# Patient Record
Sex: Male | Born: 1995 | Race: Black or African American | Hispanic: No | Marital: Single | Smoking: Never smoker
Health system: Southern US, Community
[De-identification: ages and names within clinical notes are randomized; demographics above are authoritative.]

## PROBLEM LIST (undated history)

## (undated) ENCOUNTER — Encounter

## (undated) ENCOUNTER — Ambulatory Visit: Payer: BLUE CROSS/BLUE SHIELD

## (undated) ENCOUNTER — Telehealth: Attending: MOHS-Micrographic Surgery | Primary: MOHS-Micrographic Surgery

## (undated) ENCOUNTER — Ambulatory Visit: Payer: PRIVATE HEALTH INSURANCE

## (undated) ENCOUNTER — Ambulatory Visit

## (undated) ENCOUNTER — Telehealth

## (undated) DIAGNOSIS — F909 Attention-deficit hyperactivity disorder, unspecified type: Secondary | ICD-10-CM

## (undated) HISTORY — DX: Attention-deficit hyperactivity disorder, unspecified type: F90.9

## (undated) HISTORY — PX: OTHER SURGICAL HISTORY: SHX169

## (undated) HISTORY — PX: SKIN SURGERY: SHX2413

---
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
## 2017-01-11 ENCOUNTER — Ambulatory Visit
Admission: RE | Admit: 2017-01-11 | Discharge: 2017-01-11 | Disposition: A | Discharge status: Home / Self Care | Payer: MEDICAID | Attending: Student in an Organized Health Care Education/Training Program | Admitting: Student in an Organized Health Care Education/Training Program

## 2017-01-11 ENCOUNTER — Ambulatory Visit
Admission: RE | Admit: 2017-01-11 | Discharge: 2017-01-11 | Disposition: A | Discharge status: Home / Self Care | Payer: MEDICAID

## 2017-01-11 DIAGNOSIS — L732 Hidradenitis suppurativa: Principal | ICD-10-CM

## 2017-01-11 DIAGNOSIS — Z79899 Other long term (current) drug therapy: Secondary | ICD-10-CM

## 2017-01-11 DIAGNOSIS — L910 Hypertrophic scar: Secondary | ICD-10-CM

## 2017-01-11 MED ORDER — METHOTREXATE SODIUM 2.5 MG TABLET
ORAL_TABLET | 3 refills | 0 days | Status: CP
Start: 2017-01-11 — End: 2017-03-24

## 2017-01-11 MED ORDER — HYDROCODONE 5 MG-ACETAMINOPHEN 325 MG TABLET
ORAL_TABLET | 0 refills | 0 days | Status: CP
Start: 2017-01-11 — End: 2017-03-24

## 2017-01-11 MED ORDER — DIAZEPAM 5 MG TABLET
ORAL_TABLET | 0 refills | 0 days | Status: CP
Start: 2017-01-11 — End: 2017-03-24

## 2017-02-15 MED ORDER — METHOTREXATE SODIUM 2.5 MG TABLET
ORAL_TABLET | 3 refills | 0 days | Status: CP
Start: 2017-02-15 — End: 2017-06-01

## 2017-03-01 ENCOUNTER — Ambulatory Visit: Payer: Self-pay | Admitting: Nurse Practitioner

## 2017-03-08 ENCOUNTER — Encounter: Admit: 2017-03-08 | Discharge: 2017-03-09 | Payer: BLUE CROSS/BLUE SHIELD

## 2017-03-08 DIAGNOSIS — L732 Hidradenitis suppurativa: Principal | ICD-10-CM

## 2017-03-24 ENCOUNTER — Encounter: Admit: 2017-03-24 | Discharge: 2017-03-25 | Payer: BLUE CROSS/BLUE SHIELD

## 2017-03-24 DIAGNOSIS — L732 Hidradenitis suppurativa: Principal | ICD-10-CM

## 2017-03-24 MED ORDER — HYDROCODONE 5 MG-ACETAMINOPHEN 325 MG TABLET
ORAL_TABLET | Freq: Four times a day (QID) | ORAL | 0 refills | 0.00000 days | Status: CP
Start: 2017-03-24 — End: 2017-06-01

## 2017-04-12 ENCOUNTER — Ambulatory Visit: Payer: Self-pay | Admitting: Nurse Practitioner

## 2017-04-20 ENCOUNTER — Encounter: Payer: Self-pay | Admitting: Nurse Practitioner

## 2017-04-20 ENCOUNTER — Ambulatory Visit: Payer: Medicaid Other | Admitting: Nurse Practitioner

## 2017-04-20 DIAGNOSIS — R41840 Attention and concentration deficit: Secondary | ICD-10-CM

## 2017-04-20 MED ORDER — AMPHETAMINE-DEXTROAMPHETAMINE 20 MG PO TABS: 20.0000 mg | ORAL_TABLET | Freq: Two times a day (BID) | ORAL | 0 refills | Status: DC

## 2017-04-20 NOTE — Progress Notes (Signed)
[facility] [address] [place], [place] [address]  Internal MEDICINE  Office Visit Note  Patient Name: Luis Simon  [id number]  [id number]  Date of Service: 04/28/2017  Chief Complaint  Patient presents with  . Medication Refill    currently on adderall 20mg  twice daily as needed.     The patient is here for routine follow up. He is currently taking Adderall 20mg  and using 1/2 to 1 tablet twice daily to help him stay focussed and on track. When asked, he states that his grades were better this past semester, than they have ever been. States that he has no negative side effects from taking adderall. He needs to have refills for this today.     Pt is here for routine follow up.    Current Medication: Outpatient Encounter Medications as of 04/20/2017  Medication Sig Note  . amphetamine-dextroamphetamine (ADDERALL) 20 MG tablet Take 1 tablet (20 mg total) by mouth 2 (two) times daily.   . Folic Acid 5 MG CAPS Take by mouth daily.   . [DISCONTINUED] amphetamine-dextroamphetamine (ADDERALL) 20 MG tablet Take 1 tablet (20 mg total) by mouth 2 (two) times daily.   . [DISCONTINUED] amphetamine-dextroamphetamine (ADDERALL) 20 MG tablet Take 1 tablet (20 mg total) by mouth 2 (two) times daily.   . [DISCONTINUED] lisdexamfetamine (VYVANSE) 50 MG capsule Take 50 mg by mouth daily. 04/20/2017: patient taking adderall 20mg  BID   No facility-administered encounter medications on file as of 04/20/2017.     Surgical History: History reviewed. No pertinent surgical history.  Medical History: No past medical history on file.  Family History: No family history on file.  Social History   Socioeconomic History  . Marital status: Single    Spouse name: Not on file  . Number of children: Not on file  . Years of education: Not on file  . Highest education level: Not on file  Social Needs  . Financial resource strain: Not on file  . Food insecurity - worry: Not on file  .  Food insecurity - inability: Not on file  . Transportation needs - medical: Not on file  . Transportation needs - non-medical: Not on file  Occupational History  . Not on file  Tobacco Use  . Smoking status: Never Smoker  . Smokeless tobacco: Never Used  Substance and Sexual Activity  . Alcohol use: No    Frequency: Never  . Drug use: No  . Sexual activity: Not on file  Other Topics Concern  . Not on file  Social History Narrative  . Not on file      Review of Systems  Constitutional: Negative for activity change, chills, fatigue and unexpected weight change.  HENT: Negative for congestion, postnasal drip, rhinorrhea, sneezing and sore throat.   Eyes: Negative.  Negative for redness.  Respiratory: Negative for cough, chest tightness and shortness of breath.   Cardiovascular: Negative for chest pain and palpitations.  Gastrointestinal: Negative for abdominal pain, constipation, diarrhea, nausea and vomiting.  Endocrine: Negative for cold intolerance, heat intolerance, polydipsia, polyphagia and polyuria.  Genitourinary: Negative.  Negative for dysuria and frequency.  Musculoskeletal: Negative for arthralgias, back pain, joint swelling and neck pain.  Skin: Negative for rash.  Neurological: Negative for tremors, numbness and headaches.  Hematological: Negative for adenopathy. Does not bruise/bleed easily.  Psychiatric/Behavioral: Positive for decreased concentration. Negative for behavioral problems (Depression), sleep disturbance and suicidal ideas. The patient is not nervous/anxious.     Today's Vitals   04/20/17  1012  BP: 130/70  Pulse: 81  Resp: 16  SpO2: 94%  Weight: 148 lb (67.1 kg)  Height: 5\' 9"  (1.753 m)    Physical Exam  Constitutional: He is oriented to person, place, and time. He appears well-developed and well-nourished. No distress.  HENT:  Head: Normocephalic and atraumatic.  Mouth/Throat: Oropharynx is clear and moist. No oropharyngeal exudate.  Eyes:  EOM are normal. Pupils are equal, round, and reactive to light.  Neck: Normal range of motion. Neck supple. No JVD present. No tracheal deviation present. No thyromegaly present.  Cardiovascular: Normal rate, regular rhythm and normal heart sounds. Exam reveals no gallop and no friction rub.  No murmur heard. Pulmonary/Chest: Effort normal and breath sounds normal. No respiratory distress. He has no wheezes. He has no rales. He exhibits no tenderness.  Abdominal: Soft. Bowel sounds are normal. There is no tenderness.  Musculoskeletal: Normal range of motion.  Lymphadenopathy:    He has no cervical adenopathy.  Neurological: He is alert and oriented to person, place, and time. No cranial nerve deficit.  Skin: Skin is warm and dry. He is not diaphoretic.  Psychiatric: He has a normal mood and affect. His behavior is normal. Judgment and thought content normal.  Nursing note and vitals reviewed.  Assessment/Plan: 1. Attention and concentration deficit Doing well. May continue adderall 20mg  twice daily as needed. Three 30 day prescriptions provided. Dates are 04/20/2017, 05/18/2017, and 06/16/2017.  - amphetamine-dextroamphetamine (ADDERALL) 20 MG tablet; Take 1 tablet (20 mg total) by mouth 2 (two) times daily.  Dispense: 60 tablet; Refill: 0  General Counseling: Luis Simon verbalizes understanding of the findings of todays visit and agrees with plan of treatment. I have discussed any further diagnostic evaluation that may be needed or ordered today. We also reviewed his medications today. he has been encouraged to call the office with any questions or concerns that should arise related to todays visit.   This patient was seen by Vincent GrosHeather Salimata Christenson, FNP- C in Collaboration with Dr Lyndon CodeFozia M Khan as a part of collaborative care agreement   Meds ordered this encounter  Medications  . DISCONTD: amphetamine-dextroamphetamine (ADDERALL) 20 MG tablet    Sig: Take 1 tablet (20 mg total) by mouth 2 (two) times  daily.    Dispense:  60 tablet    Refill:  0    Order Specific Question:   Supervising Provider    Answer:   Lyndon CodeKHAN, FOZIA M [[id number]]  . DISCONTD: amphetamine-dextroamphetamine (ADDERALL) 20 MG tablet    Sig: Take 1 tablet (20 mg total) by mouth 2 (two) times daily.    Dispense:  60 tablet    Refill:  0    Fill after 05/18/2017    Order Specific Question:   Supervising Provider    Answer:   Lyndon CodeKHAN, FOZIA M [[id number]]  . amphetamine-dextroamphetamine (ADDERALL) 20 MG tablet    Sig: Take 1 tablet (20 mg total) by mouth 2 (two) times daily.    Dispense:  60 tablet    Refill:  0    Fill after 06/16/2017    Order Specific Question:   Supervising Provider    Answer:   Lyndon CodeKHAN, FOZIA M [[id number]]    Time spent: 2515 Minutes    Dr Lyndon CodeFozia M Khan Internal medicine

## 2017-04-28 ENCOUNTER — Encounter: Payer: Self-pay | Admitting: Nurse Practitioner

## 2017-05-11 ENCOUNTER — Ambulatory Visit: Admit: 2017-05-11 | Discharge: 2017-05-12 | Payer: BLUE CROSS/BLUE SHIELD

## 2017-05-11 DIAGNOSIS — L732 Hidradenitis suppurativa: Principal | ICD-10-CM

## 2017-05-21 MED ORDER — PREDNISONE 20 MG TABLET
ORAL_TABLET | ORAL | 0 refills | 0.00000 days | Status: CP
Start: 2017-05-21 — End: 2017-06-01

## 2017-05-21 MED ORDER — TRAMADOL 50 MG TABLET
ORAL_TABLET | Freq: Four times a day (QID) | ORAL | 0 refills | 0.00000 days | Status: CP | PRN
Start: 2017-05-21 — End: 2017-10-13

## 2017-06-01 ENCOUNTER — Encounter: Admit: 2017-06-01 | Discharge: 2017-06-02 | Payer: MEDICAID

## 2017-06-01 DIAGNOSIS — Z79899 Other long term (current) drug therapy: Secondary | ICD-10-CM

## 2017-06-01 DIAGNOSIS — L732 Hidradenitis suppurativa: Principal | ICD-10-CM

## 2017-06-01 DIAGNOSIS — L910 Hypertrophic scar: Secondary | ICD-10-CM

## 2017-06-01 MED ORDER — HYDROCODONE 5 MG-ACETAMINOPHEN 325 MG TABLET
ORAL_TABLET | Freq: Four times a day (QID) | ORAL | 0 refills | 0.00000 days | Status: CP | PRN
Start: 2017-06-01 — End: 2017-08-25

## 2017-06-01 MED ORDER — FOLIC ACID 1 MG TABLET
ORAL_TABLET | Freq: Every day | ORAL | 3 refills | 0.00000 days | Status: CP
Start: 2017-06-01 — End: 2018-06-01

## 2017-06-01 MED ORDER — METHOTREXATE SODIUM 2.5 MG TABLET
ORAL_TABLET | ORAL | 3 refills | 0.00000 days | Status: CP
Start: 2017-06-01 — End: 2017-08-25

## 2017-06-21 ENCOUNTER — Telehealth: Payer: Self-pay

## 2017-06-21 DIAGNOSIS — R41840 Attention and concentration deficit: Secondary | ICD-10-CM

## 2017-06-21 MED ORDER — AMPHETAMINE-DEXTROAMPHETAMINE 20 MG PO TABS: 20.0000 mg | ORAL_TABLET | Freq: Two times a day (BID) | ORAL | 0 refills | Status: DC

## 2017-06-21 NOTE — Telephone Encounter (Signed)
[facility] [address]  should have another prescription for him. I sent three the day he was seen. Lat one is fill after 4/17/20169.

## 2017-06-22 NOTE — Telephone Encounter (Signed)
Pt mom advised we send pres to phar

## 2017-07-06 ENCOUNTER — Encounter: Admit: 2017-07-06 | Discharge: 2017-07-07 | Payer: BLUE CROSS/BLUE SHIELD

## 2017-07-06 DIAGNOSIS — L732 Hidradenitis suppurativa: Principal | ICD-10-CM

## 2017-07-20 ENCOUNTER — Ambulatory Visit (INDEPENDENT_AMBULATORY_CARE_PROVIDER_SITE_OTHER): Payer: Medicaid Other | Admitting: Nurse Practitioner

## 2017-07-20 ENCOUNTER — Encounter: Payer: Self-pay | Admitting: Nurse Practitioner

## 2017-07-20 VITALS — BP 104/84 | HR 56 | Resp 16 | Ht 69.0 in | Wt 154.0 lb

## 2017-07-20 DIAGNOSIS — L700 Acne vulgaris: Secondary | ICD-10-CM

## 2017-07-20 DIAGNOSIS — R41840 Attention and concentration deficit: Secondary | ICD-10-CM

## 2017-07-20 MED ORDER — CLINDAMYCIN PHOS-BENZOYL PEROX 1-5 % EX GEL: Freq: Two times a day (BID) | CUTANEOUS | 3 refills | Status: DC

## 2017-07-20 MED ORDER — AMPHETAMINE-DEXTROAMPHETAMINE 20 MG PO TABS
20.0000 mg | ORAL_TABLET | Freq: Two times a day (BID) | ORAL | 0 refills | Status: DC
Start: 2017-07-20 — End: 2017-07-20

## 2017-07-20 MED ORDER — AMPHETAMINE-DEXTROAMPHETAMINE 20 MG PO TABS: 20.0000 mg | ORAL_TABLET | Freq: Two times a day (BID) | ORAL | 0 refills | Status: DC

## 2017-07-20 NOTE — Progress Notes (Signed)
[facility] [address] [place], [place] [address]  Internal MEDICINE  Office Visit Note  Patient Name: Luis Simon  [id number]  [id number]  Date of Service: 07/20/2017  Chief Complaint  Patient presents with  . Medication Refill  . Acne    The patient is here for routine follow up. He is currently taking Adderall  and using 1/2 to 1 tablet twice daily to help him stay focussed and on track. He continues to do well in school. Grades are good. He started summer classes, online, yesterday. He states that he has no negative side effects from taking adderall. Has no issues with sleep.  He has started to notice facial acne. Breaking out around forehead and chin, especially. He has been using OTC Neutrogena wash for acne as well as OTC spot treatment. He has not noted any improvement.    Pt is here for routine follow up.    Current Medication: Outpatient Encounter Medications as of 07/20/2017  Medication Sig  . amphetamine-dextroamphetamine (ADDERALL) 20 MG tablet Take 1 tablet (20 mg total) by mouth 2 (two) times daily.  . clindamycin-benzoyl peroxide (BENZACLIN) gel Apply topically 2 (two) times daily.  . Folic Acid 5 MG CAPS Take by mouth daily.  . [DISCONTINUED] amphetamine-dextroamphetamine (ADDERALL) 20 MG tablet Take 1 tablet (20 mg total) by mouth 2 (two) times daily.  . [DISCONTINUED] amphetamine-dextroamphetamine (ADDERALL) 20 MG tablet Take 1 tablet (20 mg total) by mouth 2 (two) times daily.  . [DISCONTINUED] amphetamine-dextroamphetamine (ADDERALL) 20 MG tablet Take 1 tablet (20 mg total) by mouth 2 (two) times daily.   No facility-administered encounter medications on file as of 07/20/2017.     Surgical History: Past Surgical History:  Procedure Laterality Date  . none      Medical History: Past Medical History:  Diagnosis Date  . ADHD     Family History: Family History  Family history unknown: Yes    Social History   Socioeconomic  History  . Marital status: Single    Spouse name: Not on file  . Number of children: Not on file  . Years of education: Not on file  . Highest education level: Not on file  Occupational History  . Not on file  Social Needs  . Financial resource strain: Not on file  . Food insecurity:    Worry: Not on file    Inability: Not on file  . Transportation needs:    Medical: Not on file    Non-medical: Not on file  Tobacco Use  . Smoking status: Never Smoker  . Smokeless tobacco: Never Used  Substance and Sexual Activity  . Alcohol use: No    Frequency: Never  . Drug use: No  . Sexual activity: Not on file  Lifestyle  . Physical activity:    Days per week: Not on file    Minutes per session: Not on file  . Stress: Not on file  Relationships  . Social connections:    Talks on phone: Not on file    Gets together: Not on file    Attends religious service: Not on file    Active member of club or organization: Not on file    Attends meetings of clubs or organizations: Not on file    Relationship status: Not on file  . Intimate partner violence:    Fear of current or ex partner: Not on file    Emotionally abused: Not on file    Physically abused: Not on  file    Forced sexual activity: Not on file  Other Topics Concern  . Not on file  Social History Narrative  . Not on file      Review of Systems  Constitutional: Negative for activity change, chills, fatigue and unexpected weight change.  HENT: Negative for congestion, postnasal drip, rhinorrhea, sneezing and sore throat.   Eyes: Negative.  Negative for redness.  Respiratory: Negative for cough, chest tightness and shortness of breath.   Cardiovascular: Negative for chest pain and palpitations.  Gastrointestinal: Negative for abdominal pain, constipation, diarrhea, nausea and vomiting.  Endocrine: Negative for cold intolerance, heat intolerance, polydipsia, polyphagia and polyuria.  Genitourinary: Negative.  Negative for  dysuria and frequency.  Musculoskeletal: Negative for arthralgias, back pain, joint swelling and neck pain.  Skin: Negative for rash.       Acne on the face, mostly on forehead and chin.   Neurological: Negative for tremors, numbness and headaches.  Hematological: Negative for adenopathy. Does not bruise/bleed easily.  Psychiatric/Behavioral: Positive for decreased concentration. Negative for behavioral problems (Depression), sleep disturbance and suicidal ideas. The patient is not nervous/anxious.     Vital Signs: BP 104/84   Pulse (!) 56   Resp 16   Ht  (1.753 m)   Wt 154 lb (69.9 kg)   SpO2 97%   BMI 22.74 kg/m    Physical Exam  Constitutional: He is oriented to person, place, and time. He appears well-developed and well-nourished. No distress.  HENT:  Head: Normocephalic and atraumatic.  Mouth/Throat: Oropharynx is clear and moist. No oropharyngeal exudate.  Eyes: Pupils are equal, round, and reactive to light. EOM are normal.  Neck: Normal range of motion. Neck supple. No JVD present. No tracheal deviation present. No thyromegaly present.  Cardiovascular: Normal rate, regular rhythm and normal heart sounds. Exam reveals no gallop and no friction rub.  No murmur heard. Pulmonary/Chest: Effort normal and breath sounds normal. No respiratory distress. He has no wheezes. He has no rales. He exhibits no tenderness.  Abdominal: Soft. Bowel sounds are normal. There is no tenderness.  Musculoskeletal: Normal range of motion.  Lymphadenopathy:    He has no cervical adenopathy.  Neurological: He is alert and oriented to person, place, and time. No cranial nerve deficit.  Skin: Skin is warm and dry. He is not diaphoretic.  Mild facial acne on forehead and chin. No evidence on inflammation or cellulitis at this time.  Psychiatric: He has a normal mood and affect. His behavior is normal. Judgment and thought content normal.  Nursing note and vitals reviewed.  Assessment/Plan: 1.  Attention and concentration deficit May continue adderall  twice daily as needed. Three 30 day prescriptions sent to pharmacy. Dates on prescriptions are 07/20/2017, 08/18/2017, and 09/15/2017 - amphetamine-dextroamphetamine (ADDERALL) 20 MG tablet; Take 1 tablet (20 mg total) by mouth 2 (two) times daily.  Dispense: 60 tablet; Refill: 0  2. Acne vulgaris Use benzaclin twice daily as needed. Use sparingly to prevent negative side effects . - clindamycin-benzoyl peroxide (BENZACLIN) gel; Apply topically 2 (two) times daily.  Dispense: 25 g; Refill: 3  General Counseling: Kolbe verbalizes understanding of the findings of todays visit and agrees with plan of treatment. I have discussed any further diagnostic evaluation that may be needed or ordered today. We also reviewed his medications today. he has been encouraged to call the office with any questions or concerns that should arise related to todays visit.   This patient was seen by Vincent Gros, FNP- C  in Collaboration with Dr Lyndon Code as a part of collaborative care agreement   Meds ordered this encounter  Medications  . DISCONTD: amphetamine-dextroamphetamine (ADDERALL) 20 MG tablet    Sig: Take 1 tablet (20 mg total) by mouth 2 (two) times daily.    Dispense:  60 tablet    Refill:  0    Order Specific Question:   Supervising Provider    Answer:   Lyndon Code [[id number]]  . DISCONTD: amphetamine-dextroamphetamine (ADDERALL) 20 MG tablet    Sig: Take 1 tablet (20 mg total) by mouth 2 (two) times daily.    Dispense:  60 tablet    Refill:  0    Fill after 08/18/2017    Order Specific Question:   Supervising Provider    Answer:   Lyndon Code [[id number]]  . amphetamine-dextroamphetamine (ADDERALL) 20 MG tablet    Sig: Take 1 tablet (20 mg total) by mouth 2 (two) times daily.    Dispense:  60 tablet    Refill:  0    Fill after 09/15/2017    Order Specific Question:   Supervising Provider    Answer:   Lyndon Code [[id number]]  .  clindamycin-benzoyl peroxide (BENZACLIN) gel    Sig: Apply topically 2 (two) times daily.    Dispense:  25 g    Refill:  3    Order Specific Question:   Supervising Provider    Answer:   Lyndon Code [[id number]]    Time spent: 5 Minutes     Dr Lyndon Code Internal medicine

## 2017-08-25 ENCOUNTER — Encounter: Admit: 2017-08-25 | Discharge: 2017-08-26 | Payer: BLUE CROSS/BLUE SHIELD

## 2017-08-25 DIAGNOSIS — L732 Hidradenitis suppurativa: Principal | ICD-10-CM

## 2017-08-25 DIAGNOSIS — Z79899 Other long term (current) drug therapy: Secondary | ICD-10-CM

## 2017-08-25 MED ORDER — METHOTREXATE SODIUM 2.5 MG TABLET
ORAL_TABLET | ORAL | 3 refills | 0.00000 days | Status: CP
Start: 2017-08-25 — End: ?

## 2017-08-25 MED ORDER — HYDROCODONE 5 MG-ACETAMINOPHEN 325 MG TABLET
ORAL_TABLET | Freq: Four times a day (QID) | ORAL | 0 refills | 0.00000 days | Status: CP | PRN
Start: 2017-08-25 — End: 2017-10-13

## 2017-08-25 MED ORDER — TRIAMCINOLONE ACETONIDE 40 MG/ML SUSPENSION FOR INJECTION
Freq: Once | 0 refills | 0.00000 days | Status: CP
Start: 2017-08-25 — End: 2017-08-25

## 2017-09-21 ENCOUNTER — Encounter: Admit: 2017-09-21 | Discharge: 2017-09-22 | Payer: BLUE CROSS/BLUE SHIELD

## 2017-09-21 DIAGNOSIS — L732 Hidradenitis suppurativa: Principal | ICD-10-CM

## 2017-09-28 ENCOUNTER — Ambulatory Visit: Admit: 2017-09-28 | Discharge: 2017-09-29 | Payer: BLUE CROSS/BLUE SHIELD

## 2017-09-28 DIAGNOSIS — R52 Pain, unspecified: Secondary | ICD-10-CM

## 2017-09-28 DIAGNOSIS — L732 Hidradenitis suppurativa: Principal | ICD-10-CM

## 2017-09-28 MED ORDER — HYDROCODONE 5 MG-ACETAMINOPHEN 325 MG TABLET
ORAL_TABLET | Freq: Four times a day (QID) | ORAL | 0 refills | 0.00000 days | Status: CP | PRN
Start: 2017-09-28 — End: 2017-10-13

## 2017-10-13 ENCOUNTER — Encounter: Admit: 2017-10-13 | Discharge: 2017-10-14 | Payer: BLUE CROSS/BLUE SHIELD

## 2017-10-13 ENCOUNTER — Encounter: Payer: Self-pay | Admitting: Adult Health

## 2017-10-13 ENCOUNTER — Ambulatory Visit (INDEPENDENT_AMBULATORY_CARE_PROVIDER_SITE_OTHER): Payer: Medicaid Other | Admitting: Adult Health

## 2017-10-13 VITALS — BP 120/80 | HR 78 | Resp 16 | Ht 69.0 in | Wt 148.0 lb

## 2017-10-13 DIAGNOSIS — G8918 Other acute postprocedural pain: Secondary | ICD-10-CM

## 2017-10-13 DIAGNOSIS — L732 Hidradenitis suppurativa: Principal | ICD-10-CM

## 2017-10-13 DIAGNOSIS — L709 Acne, unspecified: Secondary | ICD-10-CM | POA: Diagnosis not present

## 2017-10-13 DIAGNOSIS — Z79899 Other long term (current) drug therapy: Secondary | ICD-10-CM

## 2017-10-13 DIAGNOSIS — R41840 Attention and concentration deficit: Secondary | ICD-10-CM | POA: Diagnosis not present

## 2017-10-13 LAB — POCT URINE DRUG SCREEN
POC Amphetamine UR: NOT DETECTED
POC BENZODIAZEPINES UR: NOT DETECTED
POC Barbiturate UR: NOT DETECTED
POC COCAINE UR: NOT DETECTED
POC ECSTASY UR: NOT DETECTED
POC MARIJUANA UR: POSITIVE — AB
POC METHADONE UR: NOT DETECTED
POC Methamphetamine UR: NOT DETECTED
POC Opiate Ur: NOT DETECTED
POC Oxycodone UR: NOT DETECTED
POC PHENCYCLIDINE UR: NOT DETECTED
POC TRICYCLICS UR: NOT DETECTED

## 2017-10-13 MED ORDER — AMPHETAMINE-DEXTROAMPHETAMINE 20 MG PO TABS: 20.0000 mg | ORAL_TABLET | Freq: Two times a day (BID) | ORAL | 0 refills | Status: DC

## 2017-10-13 MED ORDER — HYDROCODONE 5 MG-ACETAMINOPHEN 325 MG TABLET
ORAL_TABLET | Freq: Four times a day (QID) | ORAL | 0 refills | 0 days | Status: CP | PRN
Start: 2017-10-13 — End: ?

## 2017-10-13 NOTE — Progress Notes (Signed)
Luis Simon [address] [place], [place] [id number]  Internal MEDICINE  Office Visit Note  Patient Name: Luis Simon  [birth year]  [id number]  Date of Service: 11/02/2017  Chief Complaint  Patient presents with  . ADHD    Medication refills    HPI Pt here for follow up and medication refill. He reports using 20mg  Adderall twice daily for concentration and attention.  He denies chest pain, sob, palpitations, headaches or other side effects of the medication.  He recently had a cyst removed from his right axilla two weeks ago.     Current Medication: Outpatient Encounter Medications as of 10/13/2017  Medication Sig  . methotrexate (RHEUMATREX) 2.5 MG tablet Take by mouth.  Marland Kitchen. amphetamine-dextroamphetamine (ADDERALL) 20 MG tablet Take 1 tablet (20 mg total) by mouth 2 (two) times daily.  Melene Muller. [START ON 11/12/2017] amphetamine-dextroamphetamine (ADDERALL) 20 MG tablet Take 1 tablet (20 mg total) by mouth 2 (two) times daily.  Melene Muller. [START ON 12/12/2017] amphetamine-dextroamphetamine (ADDERALL) 20 MG tablet Take 1 tablet (20 mg total) by mouth 2 (two) times daily.  . clindamycin-benzoyl peroxide (BENZACLIN) gel Apply topically 2 (two) times daily.  . Folic Acid 5 MG CAPS Take by mouth daily.  . [DISCONTINUED] amphetamine-dextroamphetamine (ADDERALL) 20 MG tablet Take 1 tablet (20 mg total) by mouth 2 (two) times daily.   No facility-administered encounter medications on file as of 10/13/2017.     Surgical History: Past Surgical History:  Procedure Laterality Date  . none    . SKIN SURGERY Right    right arm anfd left groin    Medical History: Past Medical History:  Diagnosis Date  . ADHD     Family History: Family History  Family history unknown: Yes    Social History   Socioeconomic History  . Marital status: Single    Spouse name: Not on file  . Number of children: Not on file  . Years of education: Not on file  . Highest education level: Not on file   Occupational History  . Not on file  Social Needs  . Financial resource strain: Not on file  . Food insecurity:    Worry: Not on file    Inability: Not on file  . Transportation needs:    Medical: Not on file    Non-medical: Not on file  Tobacco Use  . Smoking status: Never Smoker  . Smokeless tobacco: Never Used  Substance and Sexual Activity  . Alcohol use: No    Frequency: Never  . Drug use: No  . Sexual activity: Not on file  Lifestyle  . Physical activity:    Days per week: Not on file    Minutes per session: Not on file  . Stress: Not on file  Relationships  . Social connections:    Talks on phone: Not on file    Gets together: Not on file    Attends religious service: Not on file    Active member of club or organization: Not on file    Attends meetings of clubs or organizations: Not on file    Relationship status: Not on file  . Intimate partner violence:    Fear of current or ex partner: Not on file    Emotionally abused: Not on file    Physically abused: Not on file    Forced sexual activity: Not on file  Other Topics Concern  . Not on file  Social History Narrative  . Not on file  Review of Systems  Constitutional: Negative.  Negative for chills, fatigue and unexpected weight change.  HENT: Negative.  Negative for congestion, rhinorrhea, sneezing and sore throat.   Eyes: Negative for redness.  Respiratory: Negative.  Negative for cough, chest tightness and shortness of breath.   Cardiovascular: Negative.  Negative for chest pain and palpitations.  Gastrointestinal: Negative.  Negative for abdominal pain, constipation, diarrhea, nausea and vomiting.  Endocrine: Negative.   Genitourinary: Negative.  Negative for dysuria and frequency.  Musculoskeletal: Negative.  Negative for arthralgias, back pain, joint swelling and neck pain.  Skin: Negative.  Negative for rash.  Allergic/Immunologic: Negative.   Neurological: Negative.  Negative for tremors and  numbness.  Hematological: Negative for adenopathy. Does not bruise/bleed easily.  Psychiatric/Behavioral: Negative.  Negative for behavioral problems, sleep disturbance and suicidal ideas. The patient is not nervous/anxious.     Vital Signs: BP 120/80   Pulse 78   Resp 16   Ht 5\' 9"  (1.753 m)   Wt 148 lb (67.1 kg)   SpO2 98%   BMI 21.86 kg/m    Physical Exam  Constitutional: He is oriented to person, place, and time. He appears well-developed and well-nourished. No distress.  HENT:  Head: Normocephalic and atraumatic.  Mouth/Throat: Oropharynx is clear and moist. No oropharyngeal exudate.  Eyes: Pupils are equal, round, and reactive to light. EOM are normal.  Neck: Normal range of motion. Neck supple. No JVD present. No tracheal deviation present. No thyromegaly present.  Cardiovascular: Normal rate, regular rhythm and normal heart sounds. Exam reveals no gallop and no friction rub.  No murmur heard. Pulmonary/Chest: Effort normal and breath sounds normal. No respiratory distress. He has no wheezes. He has no rales. He exhibits no tenderness.  Abdominal: Soft. There is no tenderness. There is no guarding.  Musculoskeletal: Normal range of motion.  Lymphadenopathy:    He has no cervical adenopathy.  Neurological: He is alert and oriented to person, place, and time. No cranial nerve deficit.  Skin: Skin is warm and dry. He is not diaphoretic.  Psychiatric: He has a normal mood and affect. His behavior is normal. Judgment and thought content normal.  Nursing note and vitals reviewed.  Assessment/Plan: 1. Encounter for long-term (current) use of medications Drug screen positive for THC, discussed this with patient. Also, Drug screen not positive for Adderall, pt has been out for a few days.  Discussed that if next UDS not positive for Adderall we would no longer fill RX.  - POCT Urine Drug Screen  2. Attention and concentration deficit Rs sent to pharmacy -  amphetamine-dextroamphetamine (ADDERALL) 20 MG tablet; Take 1 tablet (20 mg total) by mouth 2 (two) times daily.  Dispense: 60 tablet; Refill: 0  3. Acne, unspecified acne type Stable, pt has good results with Benzaclin.  General Counseling: Ryo verbalizes understanding of the findings of todays visit and agrees with plan of treatment. I have discussed any further diagnostic evaluation that may be needed or ordered today. We also reviewed his medications today. he has been encouraged to call the office with any questions or concerns that should arise related to todays visit.  He continues to do well in school, and does not have issues sleeping.     Orders Placed This Encounter  Procedures  . POCT Urine Drug Screen    Meds ordered this encounter  Medications  . amphetamine-dextroamphetamine (ADDERALL) 20 MG tablet    Sig: Take 1 tablet (20 mg total) by mouth 2 (two) times daily.  Dispense:  60 tablet    Refill:  0    Fill after 09/15/2017  . amphetamine-dextroamphetamine (ADDERALL) 20 MG tablet    Sig: Take 1 tablet (20 mg total) by mouth 2 (two) times daily.    Dispense:  60 tablet    Refill:  0    Do not fill before 11/12/2017  . amphetamine-dextroamphetamine (ADDERALL) 20 MG tablet    Sig: Take 1 tablet (20 mg total) by mouth 2 (two) times daily.    Dispense:  60 tablet    Refill:  0    Do not fill before 12/12/2017    Time spent:25  Minutes   This patient was seen by Blima Ledger AGNP-C in Collaboration with Dr Lyndon Code as a part of collaborative care agreement    Dr Lyndon Code Internal medicine

## 2017-10-13 NOTE — Patient Instructions (Signed)
Amphetamine; Dextroamphetamine tablets What is this medicine? AMPHETAMINE; DEXTROAMPHETAMINE(am FET a meen; dex troe am FET a meen) is used to treat attention-deficit hyperactivity disorder (ADHD). It may also be used for narcolepsy. Federal law prohibits giving this medicine to any person other than the person for whom it was prescribed. Do not share this medicine with anyone else. This medicine may be used for other purposes; ask your health care provider or pharmacist if you have questions. COMMON BRAND NAME(S): Adderall What should I tell my health care provider before I take this medicine? They need to know if you have any of these conditions: -anxiety or panic attacks -circulation problems in fingers and toes -glaucoma -hardening or blockages of the arteries or heart blood vessels -heart disease or a heart defect -high blood pressure -history of a drug or alcohol abuse problem -history of stroke -kidney disease -liver disease -mental illness -seizures -suicidal thoughts, plans, or attempt; a previous suicide attempt by you or a family member -thyroid disease -Tourette's syndrome -an unusual or allergic reaction to dextroamphetamine, other amphetamines, other medicines, foods, dyes, or preservatives -pregnant or trying to get pregnant -breast-feeding How should I use this medicine? Take this medicine by mouth with a glass of water. Follow the directions on the prescription label. Take your doses at regular intervals. Do not take your medicine more often than directed. Do not suddenly stop your medicine. You must gradually reduce the dose or you may feel withdrawal effects. Ask your doctor or health care professional for advice. Talk to your pediatrician regarding the use of this medicine in children. Special care may be needed. While this drug may be prescribed for children as young as 3 years for selected conditions, precautions do apply. Overdosage: If you think you have taken too  much of this medicine contact a poison control center or emergency room at once. NOTE: This medicine is only for you. Do not share this medicine with others. What if I miss a dose? If you miss a dose, take it as soon as you can. If it is almost time for your next dose, take only that dose. Do not take double or extra doses. What may interact with this medicine? Do not take this medicine with any of the following medications: -MAOIS like Carbex, Eldepryl, Marplan, Nardil, and Parnate -other stimulant medicines for attention disorders, weight loss, or to stay awake This medicine may also interact with the following medications: -acetazolamide -ammonium chloride -antacids -ascorbic acid -atomoxetine -caffeine -certain medicines for blood pressure -certain medicines for depression, anxiety, or psychotic disturbances -certain medicines for seizures like carbamazepine, phenobarbital, phenytoin -certain medicines for stomach problems like cimetidine, famotidine, omeprazole, lansoprazole -cold or allergy medicines -glutamic acid -lithium -meperidine -methenamine; sodium acid phosphate -narcotic medicines for pain -norepinephrine -phenothiazines like chlorpromazine, mesoridazine, prochlorperazine, thioridazine -sodium acid phosphate -sodium bicarbonate This list may not describe all possible interactions. Give your health care provider a list of all the medicines, herbs, non-prescription drugs, or dietary supplements you use. Also tell them if you smoke, drink alcohol, or use illegal drugs. Some items may interact with your medicine. What should I watch for while using this medicine? Visit your doctor or health care professional for regular checks on your progress. This prescription requires that you follow special procedures with your doctor and pharmacy. You will need to have a new written prescription from your doctor every time you need a refill. This medicine may affect your  concentration, or hide signs of tiredness. Until you know how this   medicine affects you, do not drive, ride a bicycle, use machinery, or do anything that needs mental alertness. Tell your doctor or health care professional if this medicine loses its effects, or if you feel you need to take more than the prescribed amount. Do not change the dosage without talking to your doctor or health care professional. Decreased appetite is a common side effect when starting this medicine. Eating small, frequent meals or snacks can help. Talk to your doctor if you continue to have poor eating habits. Height and weight growth of a child taking this medicine will be monitored closely. Do not take this medicine close to bedtime. It may prevent you from sleeping. If you are going to need surgery, a MRI, CT scan, or other procedure, tell your doctor that you are taking this medicine. You may need to stop taking this medicine before the procedure. Tell your doctor or healthcare professional right away if you notice unexplained wounds on your fingers and toes while taking this medicine. You should also tell your healthcare provider if you experience numbness or pain, changes in the skin color, or sensitivity to temperature in your fingers or toes. What side effects may I notice from receiving this medicine? Side effects that you should report to your doctor or health care professional as soon as possible: -allergic reactions like skin rash, itching or hives, swelling of the face, lips, or tongue -changes in vision -chest pain or chest tightness -confusion, trouble speaking or understanding -fast, irregular heartbeat -fingers or toes feel numb, cool, painful -hallucination, loss of contact with reality -high blood pressure -males: prolonged or painful erection -seizures -severe headaches -shortness of breath -suicidal thoughts or other mood changes -trouble walking, dizziness, loss of balance or  coordination -uncontrollable head, mouth, neck, arm, or leg movements Side effects that usually do not require medical attention (report to your doctor or health care professional if they continue or are bothersome): -anxious -headache -loss of appetite -nausea, vomiting -trouble sleeping -weight loss This list may not describe all possible side effects. Call your doctor for medical advice about side effects. You may report side effects to FDA at 1-800-FDA-1088. Where should I keep my medicine? Keep out of the reach of children. This medicine can be abused. Keep your medicine in a safe place to protect it from theft. Do not share this medicine with anyone. Selling or giving away this medicine is dangerous and against the law. Store at room temperature between 15 and 30 degrees C (59 and 86 degrees F). Keep container tightly closed. Throw away any unused medicine after the expiration date. Dispose of properly. This medicine may cause accidental overdose and death if it is taken by other adults, children, or pets. Mix any unused medicine with a substance like cat litter or coffee grounds. Then throw the medicine away in a sealed container like a sealed bag or a coffee can with a lid. Do not use the medicine after the expiration date. NOTE: This sheet is a summary. It may not cover all possible information. If you have questions about this medicine, talk to your doctor, pharmacist, or health care provider.  2018 Elsevier/Gold Standard (2013-12-20 18:44:41)  

## 2017-10-15 ENCOUNTER — Ambulatory Visit: Payer: Self-pay | Admitting: Nurse Practitioner

## 2018-01-11 ENCOUNTER — Ambulatory Visit: Payer: Self-pay | Admitting: Nurse Practitioner

## 2018-01-17 ENCOUNTER — Ambulatory Visit: Payer: Self-pay | Admitting: Nurse Practitioner

## 2018-01-17 ENCOUNTER — Ambulatory Visit (INDEPENDENT_AMBULATORY_CARE_PROVIDER_SITE_OTHER): Payer: Medicaid Other | Admitting: Nurse Practitioner

## 2018-01-17 ENCOUNTER — Encounter: Payer: Self-pay | Admitting: Nurse Practitioner

## 2018-01-17 VITALS — BP 136/87 | HR 84 | Resp 16 | Ht 69.0 in | Wt 161.2 lb

## 2018-01-17 DIAGNOSIS — R41840 Attention and concentration deficit: Secondary | ICD-10-CM | POA: Diagnosis not present

## 2018-01-17 DIAGNOSIS — L732 Hidradenitis suppurativa: Secondary | ICD-10-CM | POA: Insufficient documentation

## 2018-01-17 MED ORDER — AMPHETAMINE-DEXTROAMPHETAMINE 20 MG PO TABS: 20.0000 mg | ORAL_TABLET | Freq: Two times a day (BID) | ORAL | 0 refills | Status: DC

## 2018-01-17 MED ORDER — AMPHETAMINE-DEXTROAMPHETAMINE 20 MG PO TABS
20.0000 mg | ORAL_TABLET | Freq: Two times a day (BID) | ORAL | 0 refills | Status: DC
Start: 2018-01-17 — End: 2018-01-17

## 2018-01-17 NOTE — Progress Notes (Signed)
[facility] [address] [place], [place] [address]  Internal MEDICINE  Office Visit Note  Patient Name: Luis Simon  1995  [id number]  Date of Service: 01/19/2018  Chief Complaint  Patient presents with  . ADHD    The patinet is here for routine follow up of ADHD. Currently taking adderall 20mg  twice daily when needed. 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 Has noted a big improvement in his grades since he started taking Adderall. Getting assignments turned in on time and Is able to pay attention in class without problems. He needs to have refills of this medication today.  The patient has visited dermatologist since his last visit. Has been diagnosed with hidradenitis suppurative. He is now on weekly dose of methotrexate and uses benzaclin wash to help reduce incidences of flares. He recently started this treatment. Feels that flares are a little less severe.       Current Medication: Outpatient Encounter Medications as of 01/17/2018  Medication Sig  . amphetamine-dextroamphetamine (ADDERALL) 20 MG tablet Take 1 tablet (20 mg total) by mouth 2 (two) times daily.  . clindamycin-benzoyl peroxide (BENZACLIN) gel Apply topically 2 (two) times daily.  . Folic Acid 5 MG CAPS Take by mouth daily.  . methotrexate (RHEUMATREX) 2.5 MG tablet Take by mouth.  . [DISCONTINUED] amphetamine-dextroamphetamine (ADDERALL) 20 MG tablet Take 1 tablet (20 mg total) by mouth 2 (two) times daily.  . [DISCONTINUED] amphetamine-dextroamphetamine (ADDERALL) 20 MG tablet Take 1 tablet (20 mg total) by mouth 2 (two) times daily.  . [DISCONTINUED] amphetamine-dextroamphetamine (ADDERALL) 20 MG tablet Take 1 tablet (20 mg total) by mouth 2 (two) times daily.  . [DISCONTINUED] amphetamine-dextroamphetamine (ADDERALL) 20 MG tablet Take 1 tablet (20 mg total) by mouth 2 (two) times daily.  . [DISCONTINUED]  amphetamine-dextroamphetamine (ADDERALL) 20 MG tablet Take 1 tablet (20 mg total) by mouth 2 (two) times daily.   No facility-administered encounter medications on file as of 01/17/2018.     Surgical History: Past Surgical History:  Procedure Laterality Date  . none    . SKIN SURGERY Right    right arm anfd left groin    Medical History: Past Medical History:  Diagnosis Date  . ADHD     Family History: Family History  Family history unknown: Yes    Social History   Socioeconomic History  . Marital status: Single    Spouse name: Not on file  . Number of children: Not on file  . Years of education: Not on file  . Highest education level: Not on file  Occupational History  . Not on file  Social Needs  . Financial resource strain: Not on file  . Food insecurity:    Worry: Not on file    Inability: Not on file  . Transportation needs:    Medical: Not on file    Non-medical: Not on file  Tobacco Use  . Smoking status: Never Smoker  . Smokeless tobacco: Never Used  Substance and Sexual Activity  . Alcohol use: No    Frequency: Never  . Drug use: No  . Sexual activity: Not on file  Lifestyle  . Physical activity:    Days per week: Not on file    Minutes per session: Not on file  . Stress: Not on file  Relationships  . Social connections:    Talks on phone: Not on file  Gets together: Not on file    Attends religious service: Not on file    Active member of club or organization: Not on file    Attends meetings of clubs or organizations: Not on file    Relationship status: Not on file  . Intimate partner violence:    Fear of current or ex partner: Not on file    Emotionally abused: Not on file    Physically abused: Not on file    Forced sexual activity: Not on file  Other Topics Concern  . Not on file  Social History Narrative  . Not on file      Review of Systems  Constitutional: Negative for activity change, chills, fatigue and unexpected weight  change.  HENT: Negative for congestion, postnasal drip, rhinorrhea, sneezing and sore throat.   Respiratory: Negative for cough, chest tightness and shortness of breath.   Cardiovascular: Negative for chest pain and palpitations.  Endocrine: Negative for cold intolerance, heat intolerance, polydipsia, polyphagia and polyuria.  Skin: Negative for rash.       Improved acne on the face and under the arms.   Neurological: Negative for tremors and headaches.  Hematological: Negative for adenopathy. Does not bruise/bleed easily.  Psychiatric/Behavioral: Positive for decreased concentration. Negative for behavioral problems (Depression), sleep disturbance and suicidal ideas. The patient is not nervous/anxious.     Vital Signs: BP 136/87 (BP Location: Left Arm, Patient Position: Sitting, Cuff Size: Large)   Pulse 84   Resp 16   Ht 5\' 9"  (1.753 m)   Wt 161 lb 3.2 oz (73.1 kg)   SpO2 99%   BMI 23.81 kg/m    Physical Exam  Constitutional: He is oriented to person, place, and time. He appears well-developed and well-nourished. No distress.  HENT:  Head: Normocephalic and atraumatic.  Mouth/Throat: No oropharyngeal exudate.  Eyes: Pupils are equal, round, and reactive to light. EOM are normal.  Neck: Normal range of motion. Neck supple.  Cardiovascular: Normal rate, regular rhythm and normal heart sounds. Exam reveals no gallop and no friction rub.  No murmur heard. Pulmonary/Chest: Effort normal and breath sounds normal. No respiratory distress. He has no wheezes. He has no rales. He exhibits no tenderness.  Abdominal: There is no tenderness.  Neurological: He is alert and oriented to person, place, and time. No cranial nerve deficit.  Skin: Skin is warm and dry. He is not diaphoretic.  Mild facial acne on forehead and chin. No evidence on inflammation or cellulitis at this time.  Psychiatric: He has a normal mood and affect. His behavior is normal. Judgment and thought content normal.   Nursing note and vitals reviewed.  Assessment/Plan: 1. Hidradenitis suppurativa Continue regular visits with dermatology as scheduled for further evaluation and treatment.   2. Attention and concentration deficit May continue adderall 20mg  twice daily when needed. Three 30 day prescriptions were sent to his pharmacy. Dates are 01/17/2018, 02/14/2018, and 03/15/2018. - amphetamine-dextroamphetamine (ADDERALL) 20 MG tablet; Take 1 tablet (20 mg total) by mouth 2 (two) times daily.  Dispense: 60 tablet; Refill: 0  General Counseling: Jrake verbalizes understanding of the findings of todays visit and agrees with plan of treatment. I have discussed any further diagnostic evaluation that may be needed or ordered today. We also reviewed his medications today. he has been encouraged to call the office with any questions or concerns that should arise related to todays visit.  Refilled Controlled medications today. Reviewed risks and possible side effects associated with taking Stimulants. Combination  of these drugs with other psychotropic medications could cause dizziness and drowsiness. Pt needs to Monitor symptoms and exercise caution in driving and operating heavy machinery to avoid damages to oneself, to others and to the surroundings. Patient verbalized understanding in this matter. Dependence and abuse for these drugs will be monitored closely. A Controlled substance policy and procedure is on file which allows [facility] to order a urine drug screen test at any visit. Patient understands and agrees with the plan..  This patient was seen by Vincent Gros FNP Collaboration with Dr Lyndon Code as a part of collaborative care agreement  Meds ordered this encounter  Medications  . DISCONTD: amphetamine-dextroamphetamine (ADDERALL) 20 MG tablet    Sig: Take 1 tablet (20 mg total) by mouth 2 (two) times daily.    Dispense:  60 tablet    Refill:  0    Order Specific Question:    Supervising Provider    Answer:   Lyndon Code [[id number]]  . DISCONTD: amphetamine-dextroamphetamine (ADDERALL) 20 MG tablet    Sig: Take 1 tablet (20 mg total) by mouth 2 (two) times daily.    Dispense:  60 tablet    Refill:  0    Fill after 02/14/2018    Order Specific Question:   Supervising Provider    Answer:   Lyndon Code [[id number]]  . amphetamine-dextroamphetamine (ADDERALL) 20 MG tablet    Sig: Take 1 tablet (20 mg total) by mouth 2 (two) times daily.    Dispense:  60 tablet    Refill:  0    Fill after 03/15/2018    Order Specific Question:   Supervising Provider    Answer:   Lyndon Code [[id number]]    Time spent: 53 Minutes      Dr Lyndon Code Internal medicine

## 2018-02-08 ENCOUNTER — Other Ambulatory Visit: Payer: Self-pay

## 2018-02-08 DIAGNOSIS — L700 Acne vulgaris: Secondary | ICD-10-CM

## 2018-02-08 MED ORDER — CLINDAMYCIN PHOS-BENZOYL PEROX 1-5 % EX GEL: Freq: Two times a day (BID) | CUTANEOUS | 3 refills | Status: DC

## 2018-04-15 ENCOUNTER — Ambulatory Visit (INDEPENDENT_AMBULATORY_CARE_PROVIDER_SITE_OTHER): Payer: Medicaid Other | Admitting: Nurse Practitioner

## 2018-04-15 ENCOUNTER — Encounter: Payer: Self-pay | Admitting: Nurse Practitioner

## 2018-04-15 DIAGNOSIS — R41840 Attention and concentration deficit: Secondary | ICD-10-CM

## 2018-04-15 MED ORDER — AMPHETAMINE-DEXTROAMPHETAMINE 20 MG PO TABS: 20.0000 mg | ORAL_TABLET | Freq: Two times a day (BID) | ORAL | 0 refills | Status: DC

## 2018-04-15 NOTE — Progress Notes (Signed)
[facility] [address] [place], [place] [address]  Internal MEDICINE  Office Visit Note  Patient Name: Luis Coffinrevon M Zecca  [birth year]  [id number]  Date of Service: 04/21/2018  Chief Complaint  Patient presents with  . Medical Management of Chronic Issues    3 month follo up , medication refills  . Quality Metric Gaps    flu vaccine    The patinet is here for routine follow up of ADHD. Currently taking adderall 20mg  twice daily when needed. 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 Has noted a big improvement in his grades since he started taking Adderall. Getting assignments turned in on time and Is able to pay attention in class without problems. He needs to have refills of this medication today.  .       Current Medication: Outpatient Encounter Medications as of 04/15/2018  Medication Sig  . amphetamine-dextroamphetamine (ADDERALL) 20 MG tablet Take 1 tablet (20 mg total) by mouth 2 (two) times daily.  . clindamycin-benzoyl peroxide (BENZACLIN) gel Apply topically 2 (two) times daily.  . Folic Acid 5 MG CAPS Take by mouth daily.  . methotrexate (RHEUMATREX) 2.5 MG tablet Take by mouth.  . [DISCONTINUED] amphetamine-dextroamphetamine (ADDERALL) 20 MG tablet Take 1 tablet (20 mg total) by mouth 2 (two) times daily.  . [DISCONTINUED] amphetamine-dextroamphetamine (ADDERALL) 20 MG tablet Take 1 tablet (20 mg total) by mouth 2 (two) times daily.  . [DISCONTINUED] amphetamine-dextroamphetamine (ADDERALL) 20 MG tablet Take 1 tablet (20 mg total) by mouth 2 (two) times daily.   No facility-administered encounter medications on file as of 04/15/2018.     Surgical History: Past Surgical History:  Procedure Laterality Date  . none    . SKIN SURGERY Right    right arm anfd left groin    Medical History: Past Medical History:  Diagnosis Date  . ADHD     Family History: Family History   Family history unknown: Yes    Social History   Socioeconomic History  . Marital status: Single    Spouse name: Not on file  . Number of children: Not on file  . Years of education: Not on file  . Highest education level: Not on file  Occupational History  . Not on file  Social Needs  . Financial resource strain: Not on file  . Food insecurity:    Worry: Not on file    Inability: Not on file  . Transportation needs:    Medical: Not on file    Non-medical: Not on file  Tobacco Use  . Smoking status: Never Smoker  . Smokeless tobacco: Never Used  Substance and Sexual Activity  . Alcohol use: No    Frequency: Never  . Drug use: No  . Sexual activity: Not on file  Lifestyle  . Physical activity:    Days per week: Not on file    Minutes per session: Not on file  . Stress: Not on file  Relationships  . Social connections:    Talks on phone: Not on file    Gets together: Not on file    Attends religious service: Not on file    Active member of club or organization: Not on file    Attends meetings of clubs or organizations: Not on file    Relationship status: Not on file  . Intimate partner violence:    Fear of current or ex partner: Not  on file    Emotionally abused: Not on file    Physically abused: Not on file    Forced sexual activity: Not on file  Other Topics Concern  . Not on file  Social History Narrative  . Not on file      Review of Systems  Constitutional: Negative for activity change, chills, fatigue and unexpected weight change.  HENT: Negative for congestion, postnasal drip, rhinorrhea, sneezing and sore throat.   Respiratory: Negative for cough, chest tightness and shortness of breath.   Cardiovascular: Negative for chest pain and palpitations.  Endocrine: Negative for cold intolerance, heat intolerance, polydipsia, polyphagia and polyuria.  Skin: Negative for rash.       Improved acne on the face and under the arms.   Neurological: Negative for  tremors and headaches.  Hematological: Negative for adenopathy. Does not bruise/bleed easily.  Psychiatric/Behavioral: Positive for decreased concentration. Negative for behavioral problems (Depression), sleep disturbance and suicidal ideas. The patient is not nervous/anxious.     Today's Vitals   04/15/18 1541  BP: 123/69  Pulse: 79  Resp: 16  SpO2: 99%  Weight: 166 lb 6.4 oz (75.5 kg)  Height: 5\' 9"  (1.753 m)    Physical Exam Vitals signs and nursing note reviewed.  Constitutional:      General: He is not in acute distress.    Appearance: Normal appearance. He is well-developed. He is not diaphoretic.  HENT:     Head: Normocephalic and atraumatic.     Mouth/Throat:     Pharynx: No oropharyngeal exudate.  Eyes:     Pupils: Pupils are equal, round, and reactive to light.  Neck:     Musculoskeletal: Normal range of motion and neck supple.  Cardiovascular:     Rate and Rhythm: Normal rate and regular rhythm.     Heart sounds: Normal heart sounds. No murmur. No friction rub. No gallop.   Pulmonary:     Effort: Pulmonary effort is normal. No respiratory distress.     Breath sounds: Normal breath sounds. No wheezing or rales.  Chest:     Chest wall: No tenderness.  Abdominal:     Tenderness: There is no abdominal tenderness.  Skin:    General: Skin is warm and dry.     Comments: Mild facial acne on forehead and chin. No evidence on inflammation or cellulitis at this time.  Neurological:     Mental Status: He is alert and oriented to person, place, and time.     Cranial Nerves: No cranial nerve deficit.  Psychiatric:        Behavior: Behavior normal.        Thought Content: Thought content normal.        Judgment: Judgment normal.   Assessment/Plan: 1. Attention and concentration deficit Doing well with adderall 20mg  twice daily as needed. Three 30 day prescriptions were sent to his pharmacy. Dates are 04/15/2018, 05/13/2018, and 06/11/2018.  - amphetamine-dextroamphetamine  (ADDERALL) 20 MG tablet; Take 1 tablet (20 mg total) by mouth 2 (two) times daily.  Dispense: 60 tablet; Refill: 0  General Counseling: Joshuwa verbalizes understanding of the findings of todays visit and agrees with plan of treatment. I have discussed any further diagnostic evaluation that may be needed or ordered today. We also reviewed his medications today. he has been encouraged to call the office with any questions or concerns that should arise related to todays visit.  Refilled Controlled medications today. Reviewed risks and possible side effects associated with taking Stimulants.  Combination of these drugs with other psychotropic medications could cause dizziness and drowsiness. Pt needs to Monitor symptoms and exercise caution in driving and operating heavy machinery to avoid damages to oneself, to others and to the surroundings. Patient verbalized understanding in this matter. Dependence and abuse for these drugs will be monitored closely. A Controlled substance policy and procedure is on file which allows [facility] to order a urine drug screen test at any visit. Patient understands and agrees with the plan..  This patient was seen by Vincent Gros FNP Collaboration with Dr Lyndon Code as a part of collaborative care agreement  Meds ordered this encounter  Medications  . DISCONTD: amphetamine-dextroamphetamine (ADDERALL) 20 MG tablet    Sig: Take 1 tablet (20 mg total) by mouth 2 (two) times daily.    Dispense:  60 tablet    Refill:  0    Order Specific Question:   Supervising Provider    Answer:   Lyndon Code [[id number]]  . DISCONTD: amphetamine-dextroamphetamine (ADDERALL) 20 MG tablet    Sig: Take 1 tablet (20 mg total) by mouth 2 (two) times daily.    Dispense:  60 tablet    Refill:  0    Fill after 05/13/2018    Order Specific Question:   Supervising Provider    Answer:   Lyndon Code [[id number]]  . amphetamine-dextroamphetamine (ADDERALL) 20 MG tablet    Sig: Take 1  tablet (20 mg total) by mouth 2 (two) times daily.    Dispense:  60 tablet    Refill:  0    Fill after 06/11/2018    Order Specific Question:   Supervising Provider    Answer:   Lyndon Code [[id number]]    Time spent: 65 Minutes      Dr Lyndon Code Internal medicine

## 2018-07-18 ENCOUNTER — Encounter: Payer: Self-pay | Admitting: Nurse Practitioner

## 2018-07-18 ENCOUNTER — Ambulatory Visit (INDEPENDENT_AMBULATORY_CARE_PROVIDER_SITE_OTHER): Payer: Medicaid Other | Admitting: Nurse Practitioner

## 2018-07-18 ENCOUNTER — Other Ambulatory Visit: Payer: Self-pay

## 2018-07-18 VITALS — Resp 16 | Ht 70.0 in | Wt 160.3 lb

## 2018-07-18 DIAGNOSIS — J3089 Other allergic rhinitis: Secondary | ICD-10-CM | POA: Insufficient documentation

## 2018-07-18 DIAGNOSIS — R41840 Attention and concentration deficit: Secondary | ICD-10-CM

## 2018-07-18 MED ORDER — AMPHETAMINE-DEXTROAMPHETAMINE 20 MG PO TABS: 20.0000 mg | ORAL_TABLET | Freq: Two times a day (BID) | ORAL | 0 refills | Status: DC

## 2018-07-18 MED ORDER — DESLORATADINE 5 MG PO TABS: 5.0000 mg | ORAL_TABLET | Freq: Every day | ORAL | 3 refills | Status: DC

## 2018-07-18 NOTE — Progress Notes (Signed)
[facility] [address] [place], [place] [address]  Internal MEDICINE  Telephone Visit  Patient Name: Luis Simon  [id number]  [id number]  Date of Service: 07/18/2018  I connected with the patient at 4:07pm by telephone and verified the patients identity using two identifiers.   I discussed the limitations, risks, security and privacy concerns of performing an evaluation and management service by telephone and the availability of in person appointments. I also discussed with the patient that there may be a patient responsible charge related to the service.  The patient expressed understanding and agrees to proceed.    Chief Complaint  Patient presents with  . Telephone Assessment  . Telephone Screen  . ADD    The patient has been contacted via webcam for follow up visit due to concerns for spread of novel coronavirus. The patinet is here for routine follow up of ADHD. Currently taking adderall  twice daily when needed. 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 Has noted a big improvement in his grades since he started taking Adderall. Getting assignments turned in on time and Is able to pay attention in class without problems. He needs to have refills of this medication today. States that he  Ha calculus this past semester. Hardest class he has ever taken. Ended up with a C. But got two As and two Bs.  States that he has been having more trouble with allergies recently. Has been using OTC claritin and has tried Zyrtec in the past. These don't seem to be helping at this time.       Current Medication: Outpatient Encounter Medications as of 07/18/2018  Medication Sig  . amphetamine-dextroamphetamine (ADDERALL) 20 MG tablet Take 1 tablet (20 mg total) by mouth 2 (two) times daily.  . clindamycin-benzoyl peroxide (BENZACLIN) gel Apply topically 2 (two) times daily.  . Folic Acid 5 MG CAPS Take  by mouth daily.  . methotrexate (RHEUMATREX) 2.5 MG tablet Take by mouth.  . [DISCONTINUED] amphetamine-dextroamphetamine (ADDERALL) 20 MG tablet Take 1 tablet (20 mg total) by mouth 2 (two) times daily.  . [DISCONTINUED] amphetamine-dextroamphetamine (ADDERALL) 20 MG tablet Take 1 tablet (20 mg total) by mouth 2 (two) times daily.  . [DISCONTINUED] amphetamine-dextroamphetamine (ADDERALL) 20 MG tablet Take 1 tablet (20 mg total) by mouth 2 (two) times daily.  Marland Kitchen desloratadine (CLARINEX) 5 MG tablet Take 1 tablet (5 mg total) by mouth daily.   No facility-administered encounter medications on file as of 07/18/2018.     Surgical History: Past Surgical History:  Procedure Laterality Date  . none    . SKIN SURGERY Right    right arm anfd left groin    Medical History: Past Medical History:  Diagnosis Date  . ADHD     Family History: Family History  Family history unknown: Yes    Social History   Socioeconomic History  . Marital status: Single    Spouse name: Not on file  . Number of children: Not on file  . Years of education: Not on file  . Highest education level: Not on file  Occupational History  . Not on file  Social Needs  . Financial resource strain: Not on file  . Food insecurity:    Worry: Not on file    Inability: Not on file  . Transportation needs:    Medical: Not on file    Non-medical: Not on file  Tobacco Use  . Smoking status: Never Smoker  . Smokeless tobacco: Never Used  Substance and Sexual Activity  . Alcohol use: No    Frequency: Never  . Drug use: No  . Sexual activity: Not on file  Lifestyle  . Physical activity:    Days per week: Not on file    Minutes per session: Not on file  . Stress: Not on file  Relationships  . Social connections:    Talks on phone: Not on file    Gets together: Not on file    Attends religious service: Not on file    Active member of club or organization: Not on file    Attends meetings of clubs or  organizations: Not on file    Relationship status: Not on file  . Intimate partner violence:    Fear of current or ex partner: Not on file    Emotionally abused: Not on file    Physically abused: Not on file    Forced sexual activity: Not on file  Other Topics Concern  . Not on file  Social History Narrative  . Not on file      Review of Systems  Constitutional: Negative for activity change, chills, fatigue and unexpected weight change.  HENT: Positive for congestion and postnasal drip. Negative for rhinorrhea, sneezing and sore throat.   Respiratory: Negative for cough, chest tightness and shortness of breath.   Cardiovascular: Negative for chest pain and palpitations.  Endocrine: Negative for cold intolerance, heat intolerance, polydipsia, polyphagia and polyuria.  Skin: Negative for rash.       Improved acne on the face and under the arms.   Allergic/Immunologic: Positive for environmental allergies.  Neurological: Negative for tremors and headaches.  Hematological: Negative for adenopathy. Does not bruise/bleed easily.  Psychiatric/Behavioral: Positive for decreased concentration. Negative for behavioral problems (Depression), sleep disturbance and suicidal ideas. The patient is not nervous/anxious.     Today's Vitals   07/18/18 1542  Resp: 16  Weight: 160 lb 4.8 oz (72.7 kg)  Height: 5\' 10"  (1.778 m)   Body mass index is 23 kg/m.  Observation/Objective:  The patient is alert and oriented. He is pleasant and answering all questions appropriately. Breathing is non-labored. He is in no acute distress.    Assessment/Plan: 1. Non-seasonal allergic rhinitis, unspecified trigger Start clarinex 5mg  daily as claritin and zyrtec are not working any longer  - desloratadine (CLARINEX) 5 MG tablet; Take 1 tablet (5 mg total) by mouth daily.  Dispense: 30 tablet; Refill: 3  2. Attention and concentration deficit Doing well in school. Grades good. May continue adderall 20mg   twice daily as needed for focus and concentration. Three 30 day prescriptions sent to his pharmacy. Dates are 07/18/2018, 08/16/2018, and 09/13/2018 - amphetamine-dextroamphetamine (ADDERALL) 20 MG tablet; Take 1 tablet (20 mg total) by mouth 2 (two) times daily.  Dispense: 60 tablet; Refill: 0  General Counseling: Beverly verbalizes understanding of the findings of today's phone visit and agrees with plan of treatment. I have discussed any further diagnostic evaluation that may be needed or ordered today. We also reviewed his medications today. he has been encouraged to call the office with any questions or concerns that should arise related to todays visit.  Refilled Controlled medications today. Reviewed risks and possible side effects associated with taking Stimulants. Combination of these drugs with other psychotropic medications could cause dizziness and drowsiness. Pt needs to Monitor symptoms and exercise caution in driving and operating heavy machinery to avoid  damages to oneself, to others and to the surroundings. Patient verbalized understanding in this matter. Dependence and abuse for these drugs will be monitored closely. A Controlled substance policy and procedure is on file which allows [facility] to order a urine drug screen test at any visit. Patient understands and agrees with the plan..  This patient was seen by Vincent GrosHeather Javen Ridings FNP Collaboration with Dr Lyndon CodeFozia M Khan as a part of collaborative care agreement  Meds ordered this encounter  Medications  . DISCONTD: amphetamine-dextroamphetamine (ADDERALL) 20 MG tablet    Sig: Take 1 tablet (20 mg total) by mouth 2 (two) times daily.    Dispense:  60 tablet    Refill:  0    Order Specific Question:   Supervising Provider    Answer:   Lyndon CodeKHAN, FOZIA M [[id number]]  . desloratadine (CLARINEX) 5 MG tablet    Sig: Take 1 tablet (5 mg total) by mouth daily.    Dispense:  30 tablet    Refill:  3    Claritin not working. Zyrtec has not  worked for him in the past.    Order Specific Question:   Supervising Provider    Answer:   Lyndon CodeKHAN, FOZIA M [[id number]]  . DISCONTD: amphetamine-dextroamphetamine (ADDERALL) 20 MG tablet    Sig: Take 1 tablet (20 mg total) by mouth 2 (two) times daily.    Dispense:  60 tablet    Refill:  0    Fill after 08/16/2018    Order Specific Question:   Supervising Provider    Answer:   Lyndon CodeKHAN, FOZIA M [[id number]]  . amphetamine-dextroamphetamine (ADDERALL) 20 MG tablet    Sig: Take 1 tablet (20 mg total) by mouth 2 (two) times daily.    Dispense:  60 tablet    Refill:  0    Fill after 09/13/2018    Order Specific Question:   Supervising Provider    Answer:   Lyndon CodeKHAN, FOZIA M [[id number]]    Time spent: 915 Minutes    Dr Lyndon CodeFozia M Khan Internal medicine

## 2018-07-19 ENCOUNTER — Telehealth: Payer: Self-pay

## 2018-07-19 NOTE — Telephone Encounter (Signed)
Spoke with [facility] and gave PA for clarinex for 1 years is approved [id number]

## 2018-10-24 ENCOUNTER — Ambulatory Visit: Payer: Medicaid Other | Admitting: Nurse Practitioner

## 2018-10-31 ENCOUNTER — Ambulatory Visit: Payer: Medicaid Other | Admitting: Nurse Practitioner

## 2018-10-31 ENCOUNTER — Other Ambulatory Visit: Payer: Self-pay

## 2018-10-31 ENCOUNTER — Encounter: Payer: Self-pay | Admitting: Nurse Practitioner

## 2018-10-31 DIAGNOSIS — R41840 Attention and concentration deficit: Secondary | ICD-10-CM

## 2018-10-31 MED ORDER — AMPHETAMINE-DEXTROAMPHETAMINE 20 MG PO TABS: 20.0000 mg | ORAL_TABLET | Freq: Two times a day (BID) | ORAL | 0 refills | Status: DC

## 2018-10-31 NOTE — Progress Notes (Signed)
[facility] [address] [place], [place] [address]  Internal MEDICINE  Telephone Visit  Patient Name: Luis Simon  1996  [id number]  Date of Service: 10/31/2018  I connected with the patient at 11:16am by webcam and verified the patients identity using two identifiers.   I discussed the limitations, risks, security and privacy concerns of performing an evaluation and management service by webcma and the availability of in person appointments. I also discussed with the patient that there may be a patient responsible charge related to the service.  The patient expressed understanding and agrees to proceed.    Chief Complaint  Patient presents with  . Follow-up  . Medication Refill    needs all meds refilled  . Allergic Rhinitis   . ADHD    The patient has been contacted via webcam for follow up visit due to concerns for spread of novel coronavirus. The patinet is here for routine follow up of ADHD. Currently taking adderall 20mg  twice daily when needed. 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 Has noted a big improvement in his grades since he started taking Adderall. Getting assignments turned in on time and Is able to pay attention in class without problems. He needs to have refills of this medication today. States that he  Ha calculus this past semester. Hardest class he has ever taken. Ended up with a C. But got two As and two Bs.        Current Medication: Outpatient Encounter Medications as of 10/31/2018  Medication Sig  . amphetamine-dextroamphetamine (ADDERALL) 20 MG tablet Take 1 tablet (20 mg total) by mouth 2 (two) times daily.  . clindamycin-benzoyl peroxide (BENZACLIN) gel Apply topically 2 (two) times daily.  . Folic Acid 5 MG CAPS Take by mouth daily.  . methotrexate (RHEUMATREX) 2.5 MG tablet Take 7.5 mg by mouth once a week.  . [DISCONTINUED]  amphetamine-dextroamphetamine (ADDERALL) 20 MG tablet Take 1 tablet (20 mg total) by mouth 2 (two) times daily.  . [DISCONTINUED] amphetamine-dextroamphetamine (ADDERALL) 20 MG tablet Take 1 tablet (20 mg total) by mouth 2 (two) times daily.  . [DISCONTINUED] amphetamine-dextroamphetamine (ADDERALL) 20 MG tablet Take 1 tablet (20 mg total) by mouth 2 (two) times daily.  . [DISCONTINUED] desloratadine (CLARINEX) 5 MG tablet Take 1 tablet (5 mg total) by mouth daily. (Patient not taking: Reported on 10/31/2018)  . [DISCONTINUED] methotrexate (RHEUMATREX) 2.5 MG tablet Take by mouth.   No facility-administered encounter medications on file as of 10/31/2018.     Surgical History: Past Surgical History:  Procedure Laterality Date  . none    . SKIN SURGERY Right    right arm anfd left groin    Medical History: Past Medical History:  Diagnosis Date  . ADHD     Family History: Family History  Family history unknown: Yes    Social History   Socioeconomic History  . Marital status: Single    Spouse name: Not on file  . Number of children: Not on file  . Years of education: Not on file  . Highest education level: Not on file  Occupational History  . Not on file  Social Needs  . Financial resource strain: Not on file  . Food insecurity    Worry: Not on file    Inability: Not on file  . Transportation needs    Medical: Not on file    Non-medical: Not  on file  Tobacco Use  . Smoking status: Never Smoker  . Smokeless tobacco: Never Used  Substance and Sexual Activity  . Alcohol use: No    Frequency: Never  . Drug use: No  . Sexual activity: Not on file  Lifestyle  . Physical activity    Days per week: Not on file    Minutes per session: Not on file  . Stress: Not on file  Relationships  . Social Musician on phone: Not on file    Gets together: Not on file    Attends religious service: Not on file    Active member of club or organization: Not on file     Attends meetings of clubs or organizations: Not on file    Relationship status: Not on file  . Intimate partner violence    Fear of current or ex partner: Not on file    Emotionally abused: Not on file    Physically abused: Not on file    Forced sexual activity: Not on file  Other Topics Concern  . Not on file  Social History Narrative  . Not on file      Review of Systems  Constitutional: Negative for activity change, chills, fatigue and unexpected weight change.  HENT: Negative for congestion, postnasal drip, rhinorrhea, sneezing and sore throat.   Respiratory: Negative for cough, chest tightness and shortness of breath.   Cardiovascular: Negative for chest pain and palpitations.  Endocrine: Negative for cold intolerance, heat intolerance, polydipsia, polyphagia and polyuria.  Skin: Negative for rash.       Improved acne on the face and under the arms.   Allergic/Immunologic: Positive for environmental allergies.  Neurological: Negative for tremors and headaches.  Hematological: Negative for adenopathy. Does not bruise/bleed easily.  Psychiatric/Behavioral: Positive for decreased concentration. Negative for behavioral problems (Depression), sleep disturbance and suicidal ideas. The patient is not nervous/anxious.     Today's Vitals   10/31/18 1031  Weight: 160 lb 6.4 oz (72.8 kg)  Height: 5\' 9"  (1.753 m)   Body mass index is 23.69 kg/m.  Observation/Objective:   The patient is alert and oriented. He is pleasant and answering all questions appropriately. Breathing is non-labored. He is in no acute distress.    Assessment/Plan: 1. Attention and concentration deficit May continue adderall 20mg  twice daily as needed for focus and concentration. Three 30 day prescriptions were sent to his pharmacy. Dates are 10/31/2018, 11/29/2018, and 12/27/2018 - amphetamine-dextroamphetamine (ADDERALL) 20 MG tablet; Take 1 tablet (20 mg total) by mouth 2 (two) times daily.  Dispense: 60  tablet; Refill: 0  General Counseling: Abdoulaye verbalizes understanding of the findings of today's phone visit and agrees with plan of treatment. I have discussed any further diagnostic evaluation that may be needed or ordered today. We also reviewed his medications today. he has been encouraged to call the office with any questions or concerns that should arise related to todays visit.   Refilled Controlled medications today. Reviewed risks and possible side effects associated with taking Stimulants. Combination of these drugs with other psychotropic medications could cause dizziness and drowsiness. Pt needs to Monitor symptoms and exercise caution in driving and operating heavy machinery to avoid damages to oneself, to others and to the surroundings. Patient verbalized understanding in this matter. Dependence and abuse for these drugs will be monitored closely. A Controlled substance policy and procedure is on file which allows [facility] to order a urine drug screen test at any  visit. Patient understands and agrees with the plan..  This patient was seen by Leretha Pol FNP Collaboration with Dr Lavera Guise as a part of collaborative care agreement  Meds ordered this encounter  Medications  . DISCONTD: amphetamine-dextroamphetamine (ADDERALL) 20 MG tablet    Sig: Take 1 tablet (20 mg total) by mouth 2 (two) times daily.    Dispense:  60 tablet    Refill:  0    Order Specific Question:   Supervising Provider    Answer:   Lavera Guise [[id number]]  . DISCONTD: amphetamine-dextroamphetamine (ADDERALL) 20 MG tablet    Sig: Take 1 tablet (20 mg total) by mouth 2 (two) times daily.    Dispense:  60 tablet    Refill:  0    Fill after 11/29/2018    Order Specific Question:   Supervising Provider    Answer:   Lavera Guise [[id number]]  . amphetamine-dextroamphetamine (ADDERALL) 20 MG tablet    Sig: Take 1 tablet (20 mg total) by mouth 2 (two) times daily.    Dispense:  60 tablet    Refill:  0     Fill after 12/27/2018    Order Specific Question:   Supervising Provider    Answer:   Lavera Guise [[id number]]    Time spent: 76 Minutes    Dr Lavera Guise Internal medicine

## 2018-12-02 ENCOUNTER — Other Ambulatory Visit: Payer: Self-pay

## 2018-12-02 DIAGNOSIS — L700 Acne vulgaris: Secondary | ICD-10-CM

## 2018-12-02 MED ORDER — METHOTREXATE (ANTI-RHEUMATIC) 2.5 MG PO TABS: 7.5000 mg | ORAL_TABLET | ORAL | 3 refills | Status: DC

## 2018-12-02 MED ORDER — FOLIC ACID 5 MG PO CAPS: 5.0000 mg | ORAL_CAPSULE | Freq: Every day | ORAL | 3 refills | Status: DC

## 2018-12-02 MED ORDER — CLINDAMYCIN PHOS-BENZOYL PEROX 1-5 % EX GEL: Freq: Two times a day (BID) | CUTANEOUS | 3 refills | Status: DC

## 2018-12-05 ENCOUNTER — Telehealth: Payer: Self-pay

## 2018-12-05 NOTE — Telephone Encounter (Signed)
lmom 

## 2018-12-05 NOTE — Telephone Encounter (Signed)
His pharmacy should have two prescriptions for him on hold. He just has to call them to fill. I did send three at the time of his visit.

## 2018-12-15 ENCOUNTER — Other Ambulatory Visit: Payer: Self-pay | Admitting: Nurse Practitioner

## 2018-12-15 MED ORDER — CLINDAMYCIN PHOS-BENZOYL PEROX 1.2-5 % EX GEL: CUTANEOUS | 3 refills | Status: DC

## 2019-01-24 ENCOUNTER — Telehealth: Payer: Self-pay

## 2019-01-24 NOTE — Telephone Encounter (Signed)
LMOM FOR PATIENT TO CONFIRM AND SCREEN FOR COVID FOR 01-30-19 OV.

## 2019-01-30 ENCOUNTER — Encounter: Payer: Self-pay | Admitting: Nurse Practitioner

## 2019-01-30 ENCOUNTER — Ambulatory Visit: Payer: Medicaid Other | Admitting: Nurse Practitioner

## 2019-01-30 ENCOUNTER — Other Ambulatory Visit: Payer: Self-pay

## 2019-01-30 DIAGNOSIS — L732 Hidradenitis suppurativa: Secondary | ICD-10-CM

## 2019-01-30 DIAGNOSIS — R41840 Attention and concentration deficit: Secondary | ICD-10-CM

## 2019-01-30 DIAGNOSIS — L709 Acne, unspecified: Secondary | ICD-10-CM

## 2019-01-30 MED ORDER — AMPHETAMINE-DEXTROAMPHETAMINE 20 MG PO TABS: 20.0000 mg | ORAL_TABLET | Freq: Two times a day (BID) | ORAL | 0 refills | Status: DC

## 2019-01-30 MED ORDER — CLINDAMYCIN PHOS-BENZOYL PEROX 1.2-5 % EX GEL: CUTANEOUS | 3 refills | Status: DC

## 2019-01-30 NOTE — Progress Notes (Signed)
[facility] [address] [place], [place] [address]  Internal MEDICINE  Telephone Visit  Patient Name: Luis Simon  [id number]  [id number]  Date of Service: 02/05/2019  I connected with the patient at 4:49pm by telephone and verified the patients identity using two identifiers.   I discussed the limitations, risks, security and privacy concerns of performing an evaluation and management service by telephone and the availability of in person appointments. I also discussed with the patient that there may be a patient responsible charge related to the service.  The patient expressed understanding and agrees to proceed.    Chief Complaint  Patient presents with  . ADHD    The patient has been contacted via telephone for follow up visit due to concerns for spread of novel coronavirus. The patinet is here for routine follow up of ADHD. Currently taking adderall 20mg  twice daily when needed. He is a full time at [employer]. Has noted a big improvement in his grades since he started taking Adderall. Getting assignments turned in on time and Is able to pay attention in class without problems. He needs to have refills of this medication today.      Current Medication: Outpatient Encounter Medications as of 01/30/2019  Medication Sig  . amphetamine-dextroamphetamine (ADDERALL) 20 MG tablet Take 1 tablet (20 mg total) by mouth 2 (two) times daily.  . Clindamycin-Benzoyl Per, Refr, gel Apply topically twice daily  . Folic Acid 5 MG CAPS Take 1 capsule (5 mg total) by mouth daily.  . methotrexate (RHEUMATREX) 2.5 MG tablet Take 3 tablets (7.5 mg total) by mouth once a week.  . [DISCONTINUED] amphetamine-dextroamphetamine (ADDERALL) 20 MG tablet Take 1 tablet (20 mg total) by mouth 2 (two) times daily.  . [DISCONTINUED] amphetamine-dextroamphetamine (ADDERALL) 20 MG tablet Take 1 tablet (20 mg total) by mouth 2 (two) times daily.  . [DISCONTINUED]  amphetamine-dextroamphetamine (ADDERALL) 20 MG tablet Take 1 tablet (20 mg total) by mouth 2 (two) times daily.  . [DISCONTINUED] Clindamycin-Benzoyl Per, Refr, gel Apply topically twice daily   No facility-administered encounter medications on file as of 01/30/2019.     Surgical History: Past Surgical History:  Procedure Laterality Date  . none    . SKIN SURGERY Right    right arm anfd left groin    Medical History: Past Medical History:  Diagnosis Date  . ADHD     Family History: Family History  Family history unknown: Yes    Social History   Socioeconomic History  . Marital status: Single    Spouse name: Not on file  . Number of children: Not on file  . Years of education: Not on file  . Highest education level: Not on file  Occupational History  . Not on file  Social Needs  . Financial resource strain: Not on file  . Food insecurity    Worry: Not on file    Inability: Not on file  . Transportation needs    Medical: Not on file    Non-medical: Not on file  Tobacco Use  . Smoking status: Never Smoker  . Smokeless tobacco: Never Used  Substance and Sexual Activity  . Alcohol use: No    Frequency: Never  . Drug use: No  . Sexual activity: Not on file  Lifestyle  . Physical activity    Days per week: Not on file    Minutes per session: Not on file  . Stress: Not on file  Relationships  . Social  connections    Talks on phone: Not on file    Gets together: Not on file    Attends religious service: Not on file    Active member of club or organization: Not on file    Attends meetings of clubs or organizations: Not on file    Relationship status: Not on file  . Intimate partner violence    Fear of current or ex partner: Not on file    Emotionally abused: Not on file    Physically abused: Not on file    Forced sexual activity: Not on file  Other Topics Concern  . Not on file  Social History Narrative  . Not on file      Review of Systems   Constitutional: Negative for activity change, chills, fatigue and unexpected weight change.  HENT: Negative for congestion, postnasal drip, rhinorrhea, sneezing and sore throat.   Respiratory: Negative for cough, chest tightness and shortness of breath.   Cardiovascular: Negative for chest pain and palpitations.  Endocrine: Negative for cold intolerance, heat intolerance, polydipsia and polyuria.  Skin: Negative for rash.       Improved acne on the face and under the arms.   Allergic/Immunologic: Positive for environmental allergies.  Neurological: Negative for tremors and headaches.  Hematological: Negative for adenopathy. Does not bruise/bleed easily.  Psychiatric/Behavioral: Positive for decreased concentration. Negative for behavioral problems (Depression), sleep disturbance and suicidal ideas. The patient is not nervous/anxious.     Vital Signs: There were no vitals taken for this visit.   Observation/Objective:  The patient is alert and oriented. He is pleasant and answering all questions appropriately. Breathing is non-labored. He is in no acute distress.    Assessment/Plan:  1. Acne, unspecified acne type May continue to use clindamycin/benzoyl peroxide gel - use twice daily as needed. New prescription sent to his pharmacy.  - Clindamycin-Benzoyl Per, Refr, gel; Apply topically twice daily  Dispense: 45 g; Refill: 3  2. Hidradenitis suppurativa May continue to use clindamycin/benzoyl peroxide gel - use twice daily as needed. New prescription sent to his pharmacy.  - Clindamycin-Benzoyl Per, Refr, gel; Apply topically twice daily  Dispense: 45 g; Refill: 3  3. Attention and concentration deficit May continue adderall 20mg  twice daily as needed for focus and attention. Three 30 day prescriptions provided today. Dates are 01/30/2019, 02/27/2019, and 03/28/2019 - amphetamine-dextroamphetamine (ADDERALL) 20 MG tablet; Take 1 tablet (20 mg total) by mouth 2 (two) times daily.   Dispense: 60 tablet; Refill: 0  General Counseling: Glendell verbalizes understanding of the findings of today's phone visit and agrees with plan of treatment. I have discussed any further diagnostic evaluation that may be needed or ordered today. We also reviewed his medications today. he has been encouraged to call the office with any questions or concerns that should arise related to todays visit.  Refilled Controlled medications today. Reviewed risks and possible side effects associated with taking Stimulants. Combination of these drugs with other psychotropic medications could cause dizziness and drowsiness. Pt needs to Monitor symptoms and exercise caution in driving and operating heavy machinery to avoid damages to oneself, to others and to the surroundings. Patient verbalized understanding in this matter. Dependence and abuse for these drugs will be monitored closely. A Controlled substance policy and procedure is on file which allows [facility] to order a urine drug screen test at any visit. Patient understands and agrees with the plan..  This patient was seen by Vincent GrosHeather Tristen Luce FNP Collaboration with Dr Tora KindredFozia  Berna Spare as a part of collaborative care agreement  Meds ordered this encounter  Medications  . Clindamycin-Benzoyl Per, Refr, gel    Sig: Apply topically twice daily    Dispense:  45 g    Refill:  3    This is ok to fill for patient.    Order Specific Question:   Supervising Provider    Answer:   Lavera Guise [[id number]]  . DISCONTD: amphetamine-dextroamphetamine (ADDERALL) 20 MG tablet    Sig: Take 1 tablet (20 mg total) by mouth 2 (two) times daily.    Dispense:  60 tablet    Refill:  0    Order Specific Question:   Supervising Provider    Answer:   Lavera Guise [[id number]]  . DISCONTD: amphetamine-dextroamphetamine (ADDERALL) 20 MG tablet    Sig: Take 1 tablet (20 mg total) by mouth 2 (two) times daily.    Dispense:  60 tablet    Refill:  0    Fill after 02/27/2019     Order Specific Question:   Supervising Provider    Answer:   Lavera Guise [[id number]]  . amphetamine-dextroamphetamine (ADDERALL) 20 MG tablet    Sig: Take 1 tablet (20 mg total) by mouth 2 (two) times daily.    Dispense:  60 tablet    Refill:  0    Fill after 03/28/2019    Order Specific Question:   Supervising Provider    Answer:   Lavera Guise [[id number]]    Time spent: 25 Minutes    Dr Lavera Guise Internal medicine

## 2019-05-18 ENCOUNTER — Telehealth: Payer: Self-pay

## 2019-05-18 NOTE — Telephone Encounter (Signed)
CONFIRMED AND SCREENED FOR 05-22-19 OV. 

## 2019-05-22 ENCOUNTER — Ambulatory Visit: Payer: Medicaid Other | Admitting: Nurse Practitioner

## 2019-05-22 ENCOUNTER — Other Ambulatory Visit: Payer: Self-pay

## 2019-05-22 ENCOUNTER — Encounter: Payer: Self-pay | Admitting: Nurse Practitioner

## 2019-05-22 VITALS — BP 123/52 | HR 57 | Temp 97.3°F | Resp 16 | Ht 69.0 in | Wt 163.2 lb

## 2019-05-22 DIAGNOSIS — Z79899 Other long term (current) drug therapy: Secondary | ICD-10-CM

## 2019-05-22 DIAGNOSIS — R41840 Attention and concentration deficit: Secondary | ICD-10-CM

## 2019-05-22 DIAGNOSIS — L732 Hidradenitis suppurativa: Secondary | ICD-10-CM

## 2019-05-22 LAB — POCT URINE DRUG SCREEN
POC Amphetamine UR: POSITIVE — AB
POC Barbiturate UR: NOT DETECTED
POC Cocaine UR: NOT DETECTED
POC Ecstasy UR: NOT DETECTED
POC Marijuana UR: POSITIVE — AB
POC Methadone UR: NOT DETECTED
POC Methamphetamine UR: NOT DETECTED
POC Opiate Ur: NOT DETECTED
POC Oxycodone UR: NOT DETECTED
POC PHENCYCLIDINE UR: NOT DETECTED
POC TRICYCLICS UR: POSITIVE — AB

## 2019-05-22 MED ORDER — AMPHETAMINE-DEXTROAMPHETAMINE 20 MG PO TABS: 20.0000 mg | ORAL_TABLET | Freq: Two times a day (BID) | ORAL | 0 refills | Status: DC

## 2019-05-22 MED ORDER — CLINDAMYCIN PHOS-BENZOYL PEROX 1.2-5 % EX GEL: CUTANEOUS | 3 refills | Status: DC

## 2019-05-22 NOTE — Progress Notes (Signed)
[facility] [place], [place] [address]  Internal MEDICINE  Office Visit Note  Patient Name: Luis Simon  [id number]  [id number]  Date of Service: 05/28/2019  Chief Complaint  Patient presents with  . ADD    The patient is here for routine follow up. Currently taking adderall 20mg  twice daily when needed. 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 Has noted a big improvement in his grades since he started taking Adderall. Getting assignments turned in on time and Is able to pay attention in class without problems. He needs to have refills of this medication today.      Current Medication: Outpatient Encounter Medications as of 05/22/2019  Medication Sig  . amphetamine-dextroamphetamine (ADDERALL) 20 MG tablet Take 1 tablet (20 mg total) by mouth 2 (two) times daily.  . Clindamycin-Benzoyl Per, Refr, gel Apply topically twice daily  . Folic Acid 5 MG CAPS Take 1 capsule (5 mg total) by mouth daily.  . methotrexate (RHEUMATREX) 2.5 MG tablet Take 3 tablets (7.5 mg total) by mouth once a week.  . [DISCONTINUED] amphetamine-dextroamphetamine (ADDERALL) 20 MG tablet Take 1 tablet (20 mg total) by mouth 2 (two) times daily.  . [DISCONTINUED] amphetamine-dextroamphetamine (ADDERALL) 20 MG tablet Take 1 tablet (20 mg total) by mouth 2 (two) times daily.  . [DISCONTINUED] amphetamine-dextroamphetamine (ADDERALL) 20 MG tablet Take 1 tablet (20 mg total) by mouth 2 (two) times daily.  . [DISCONTINUED] Clindamycin-Benzoyl Per, Refr, gel Apply topically twice daily   No facility-administered encounter medications on file as of 05/22/2019.    Surgical History: Past Surgical History:  Procedure Laterality Date  . none    . SKIN SURGERY Right    right arm anfd left groin    Medical History: Past Medical History:  Diagnosis Date  . ADHD     Family History: Family  History  Family history unknown: Yes    Social History   Socioeconomic History  . Marital status: Single    Spouse name: Not on file  . Number of children: Not on file  . Years of education: Not on file  . Highest education level: Not on file  Occupational History  . Not on file  Tobacco Use  . Smoking status: Never Smoker  . Smokeless tobacco: Never Used  Substance and Sexual Activity  . Alcohol use: No  . Drug use: No  . Sexual activity: Not on file  Other Topics Concern  . Not on file  Social History Narrative  . Not on file   Social Determinants of Health   Financial Resource Strain:   . Difficulty of Paying Living Expenses:   Food Insecurity:   . Worried About Charity fundraiser in the Last Year:   . Arboriculturist in the Last Year:   Transportation Needs:   . Film/video editor (Medical):   Marland Kitchen Lack of Transportation (Non-Medical):   Physical Activity:   . Days of Exercise per Week:   . Minutes of Exercise per Session:   Stress:   . Feeling of Stress :   Social Connections:   . Frequency of Communication with Friends and Family:   . Frequency of Social Gatherings with Friends and Family:   . Attends Religious Services:   . Active Member of Clubs or Organizations:   . Attends Archivist Meetings:   Marland Kitchen Marital Status:  Intimate Partner Violence:   . Fear of Current or Ex-Partner:   . Emotionally Abused:   Marland Kitchen Physically Abused:   . Sexually Abused:       Review of Systems  Constitutional: Negative for activity change, chills, fatigue and unexpected weight change.  HENT: Negative for congestion, postnasal drip, rhinorrhea, sneezing and sore throat.   Respiratory: Negative for cough, chest tightness and shortness of breath.   Cardiovascular: Negative for chest pain and palpitations.  Endocrine: Negative for cold intolerance, heat intolerance, polydipsia and polyuria.  Skin: Negative for rash.       Improved acne on face and arms.    Allergic/Immunologic: Positive for environmental allergies.  Neurological: Negative for tremors and headaches.  Hematological: Negative for adenopathy. Does not bruise/bleed easily.  Psychiatric/Behavioral: Positive for decreased concentration. Negative for behavioral problems (Depression), sleep disturbance and suicidal ideas. The patient is not nervous/anxious.     Today's Vitals   05/22/19 0922  BP: (!) 123/52  Pulse: (!) 57  Resp: 16  Temp: (!) 97.3 F (36.3 C)  SpO2: 99%  Weight: 163 lb 3.2 oz (74 kg)  Height: 5\' 9"  (1.753 m)   Body mass index is 24.1 kg/m.  Physical Exam Vitals and nursing note reviewed.  Constitutional:      General: He is not in acute distress.    Appearance: Normal appearance. He is well-developed. He is not diaphoretic.  HENT:     Head: Normocephalic and atraumatic.     Mouth/Throat:     Pharynx: No oropharyngeal exudate.  Eyes:     Pupils: Pupils are equal, round, and reactive to light.  Cardiovascular:     Rate and Rhythm: Normal rate and regular rhythm.     Heart sounds: Normal heart sounds. No murmur. No friction rub. No gallop.   Pulmonary:     Effort: Pulmonary effort is normal. No respiratory distress.     Breath sounds: Normal breath sounds. No wheezing or rales.  Chest:     Chest wall: No tenderness.  Abdominal:     Tenderness: There is no abdominal tenderness.  Musculoskeletal:     Cervical back: Normal range of motion and neck supple.  Skin:    General: Skin is warm and dry.     Comments: Mild facial acne on forehead and chin. No evidence of inflammation or cellulitis at this time.  Neurological:     Mental Status: He is alert and oriented to person, place, and time.     Cranial Nerves: No cranial nerve deficit.  Psychiatric:        Behavior: Behavior normal.        Thought Content: Thought content normal.        Judgment: Judgment normal.    Assessment/Plan: 1. Hidradenitis suppurativa Continue to use clindamycin/benzyl  peroxide gel twice daily as needed. Refills provided today.  - Clindamycin-Benzoyl Per, Refr, gel; Apply topically twice daily  Dispense: 45 g; Refill: 3  2. Attention and concentration deficit May continue to take adderall 20mg  up to twice daily as needed for focus and concentration. Tree 30 day prescriptions sent to his pharmacy today. Dates are 05/22/2019, 06/20/2019, and 07/18/2019 - amphetamine-dextroamphetamine (ADDERALL) 20 MG tablet; Take 1 tablet (20 mg total) by mouth 2 (two) times daily.  Dispense: 60 tablet; Refill: 0  3. Encounter for long-term (current) use of medications - POCT Urine Drug Screen positive for AMP and THC. Patient admits to social use of marijuana with friends and classmates.   General Counseling:  Senan verbalizes understanding of the findings of todays visit and agrees with plan of treatment. I have discussed any further diagnostic evaluation that may be needed or ordered today. We also reviewed his medications today. he has been encouraged to call the office with any questions or concerns that should arise related to todays visit.  Refilled Controlled medications today. Reviewed risks and possible side effects associated with taking Stimulants. Combination of these drugs with other psychotropic medications could cause dizziness and drowsiness. Pt needs to Monitor symptoms and exercise caution in driving and operating heavy machinery to avoid damages to oneself, to others and to the surroundings. Patient verbalized understanding in this matter. Dependence and abuse for these drugs will be monitored closely. A Controlled substance policy and procedure is on file which allows [facility] to order a urine drug screen test at any visit. Patient understands and agrees with the plan..  This patient was seen by Vincent Gros FNP Collaboration with Dr Lyndon Code as a part of collaborative care agreement  Orders Placed This Encounter  Procedures  . POCT Urine Drug  Screen    Meds ordered this encounter  Medications  . DISCONTD: amphetamine-dextroamphetamine (ADDERALL) 20 MG tablet    Sig: Take 1 tablet (20 mg total) by mouth 2 (two) times daily.    Dispense:  60 tablet    Refill:  0    Order Specific Question:   Supervising Provider    Answer:   Lyndon Code [[id number]]  . DISCONTD: amphetamine-dextroamphetamine (ADDERALL) 20 MG tablet    Sig: Take 1 tablet (20 mg total) by mouth 2 (two) times daily.    Dispense:  60 tablet    Refill:  0    Fill after 06/20/2019    Order Specific Question:   Supervising Provider    Answer:   Lyndon Code [[id number]]  . Clindamycin-Benzoyl Per, Refr, gel    Sig: Apply topically twice daily    Dispense:  45 g    Refill:  3    This is ok to fill for patient.    Order Specific Question:   Supervising Provider    Answer:   Lyndon Code [[id number]]  . amphetamine-dextroamphetamine (ADDERALL) 20 MG tablet    Sig: Take 1 tablet (20 mg total) by mouth 2 (two) times daily.    Dispense:  60 tablet    Refill:  0    Fill after 07/18/2019    Order Specific Question:   Supervising Provider    Answer:   Lyndon Code [[id number]]    Total time spent: 30 Minutes   Time spent includes review of chart, medications, test results, and follow up plan with the patient.      Dr Lyndon Code Internal medicine

## 2019-05-28 DIAGNOSIS — Z79899 Other long term (current) drug therapy: Secondary | ICD-10-CM | POA: Insufficient documentation

## 2019-08-17 ENCOUNTER — Telehealth: Payer: Self-pay

## 2019-08-17 NOTE — Telephone Encounter (Signed)
Confirmed and screened for 08-21-19 ov. 

## 2019-08-21 ENCOUNTER — Ambulatory Visit: Payer: Medicaid Other | Admitting: Nurse Practitioner

## 2019-08-22 ENCOUNTER — Telehealth: Payer: Self-pay

## 2019-08-22 NOTE — Telephone Encounter (Signed)
Confirmed and screened for 08-24-19 ov. 

## 2019-08-24 ENCOUNTER — Ambulatory Visit: Payer: Medicaid Other | Admitting: Nurse Practitioner

## 2019-08-24 ENCOUNTER — Encounter: Payer: Self-pay | Admitting: Nurse Practitioner

## 2019-08-24 VITALS — Ht 69.0 in | Wt 165.0 lb

## 2019-08-24 DIAGNOSIS — L709 Acne, unspecified: Secondary | ICD-10-CM

## 2019-08-24 DIAGNOSIS — R41840 Attention and concentration deficit: Secondary | ICD-10-CM

## 2019-08-24 MED ORDER — AMPHETAMINE-DEXTROAMPHETAMINE 20 MG PO TABS: 20.0000 mg | ORAL_TABLET | Freq: Two times a day (BID) | ORAL | 0 refills | Status: DC

## 2019-08-24 NOTE — Progress Notes (Signed)
Luis Simon [place], [place] [address]  Internal MEDICINE  Telephone Visit  Patient Name: Luis Simon  [id number]  [id number]  Date of Service: 08/30/2019  I connected with the patient at 2:15pm by webcam and verified the patients identity using two identifiers.   I discussed the limitations, risks, security and privacy concerns of performing an evaluation and management service by webcam and the availability of in person appointments. I also discussed with the patient that there may be a patient responsible charge related to the service.  The patient expressed understanding and agrees to proceed.    Chief Complaint  Patient presents with  . Telephone Screen  . Telephone Assessment  . ADHD    The patient has been contacted via webcam for follow up visit due to concerns for spread of novel coronavirus.  He is currently living in [place] for school. He just started summer session. Only has eight more classes to go before he finishes his bachelor's degree. States that he was on Dean's list this past spring. He is handing in assignments on time and able to pay attention in class. Does take adderall 20mg  tablets twice daily as needed to help with focus and concentrate. He states that he does well with this dose without any negative side effects.       Current Medication: Outpatient Encounter Medications as of 08/24/2019  Medication Sig  . amphetamine-dextroamphetamine (ADDERALL) 20 MG tablet Take 1 tablet (20 mg total) by mouth 2 (two) times daily.  . Clindamycin-Benzoyl Per, Refr, gel Apply topically twice daily  . Folic Acid 5 MG CAPS Take 1 capsule (5 mg total) by mouth daily.  . methotrexate (RHEUMATREX) 2.5 MG tablet Take 3 tablets (7.5 mg total) by mouth once a week.  . [DISCONTINUED] amphetamine-dextroamphetamine (ADDERALL) 20 MG tablet Take 1 tablet (20 mg total) by mouth 2 (two) times daily.  . [DISCONTINUED] amphetamine-dextroamphetamine  (ADDERALL) 20 MG tablet Take 1 tablet (20 mg total) by mouth 2 (two) times daily.  . [DISCONTINUED] amphetamine-dextroamphetamine (ADDERALL) 20 MG tablet Take 1 tablet (20 mg total) by mouth 2 (two) times daily.   No facility-administered encounter medications on file as of 08/24/2019.    Surgical History: Past Surgical History:  Procedure Laterality Date  . none    . SKIN SURGERY Right    right arm anfd left groin    Medical History: Past Medical History:  Diagnosis Date  . ADHD     Family History: Family History  Family history unknown: Yes    Social History   Socioeconomic History  . Marital status: Single    Spouse name: Not on file  . Number of children: Not on file  . Years of education: Not on file  . Highest education level: Not on file  Occupational History  . Not on file  Tobacco Use  . Smoking status: Never Smoker  . Smokeless tobacco: Never Used  Vaping Use  . Vaping Use: Never used  Substance and Sexual Activity  . Alcohol use: No  . Drug use: No  . Sexual activity: Not on file  Other Topics Concern  . Not on file  Social History Narrative  . Not on file   Social Determinants of Health   Financial Resource Strain:   . Difficulty of Paying Living Expenses:   Food Insecurity:   . Worried About Charity fundraiser in the Last Year:   . [place] in the Last Year:  Transportation Needs:   . Freight forwarder (Medical):   Marland Kitchen Lack of Transportation (Non-Medical):   Physical Activity:   . Days of Exercise per Week:   . Minutes of Exercise per Session:   Stress:   . Feeling of Stress :   Social Connections:   . Frequency of Communication with Friends and Family:   . Frequency of Social Gatherings with Friends and Family:   . Attends Religious Services:   . Active Member of Clubs or Organizations:   . Attends Banker Meetings:   Marland Kitchen Marital Status:   Intimate Partner Violence:   . Fear of Current or Ex-Partner:   .  Emotionally Abused:   Marland Kitchen Physically Abused:   . Sexually Abused:       Review of Systems  Constitutional: Negative for activity change, chills, fatigue and unexpected weight change.  HENT: Negative for congestion, postnasal drip, rhinorrhea, sneezing and sore throat.   Respiratory: Negative for cough, chest tightness and shortness of breath.   Cardiovascular: Negative for chest pain and palpitations.  Endocrine: Negative for cold intolerance, heat intolerance, polydipsia and polyuria.  Skin: Negative for rash.       Improved acne on face and arms.   Allergic/Immunologic: Positive for environmental allergies.  Neurological: Negative for tremors and headaches.  Hematological: Negative for adenopathy. Does not bruise/bleed easily.  Psychiatric/Behavioral: Positive for decreased concentration. Negative for behavioral problems (Depression), sleep disturbance and suicidal ideas. The patient is not nervous/anxious.     Today's Vitals   08/24/19 1413  Weight: 165 lb (74.8 kg)  Height: 5\' 9"  (1.753 m)   Body mass index is 24.37 kg/m.  Observation/Objective:  The patient is alert and oriented. He is pleasant and answering all questions appropriately. Breathing is non-labored. He is in no acute distress.    Assessment/Plan:  1. Acne, unspecified acne type Doing well. Continue to use medication as needed and as prescribed. Follow up with dermatology as scheduled.   2. Attention and concentration deficit May continue adderall 20mg  twice daily as needed for focus and concentration. Three 30 day prescriptions sent to his pharmacy. Dates are 08/24/2019, 09/21/2019, and 10/20/2019 - amphetamine-dextroamphetamine (ADDERALL) 20 MG tablet; Take 1 tablet (20 mg total) by mouth 2 (two) times daily.  Dispense: 60 tablet; Refill: 0  General Counseling: Luis Simon verbalizes understanding of the findings of today's phone visit and agrees with plan of treatment. I have discussed any further diagnostic  evaluation that may be needed or ordered today. We also reviewed his medications today. he has been encouraged to call the office with any questions or concerns that should arise related to todays visit.  Refilled Controlled medications today. Reviewed risks and possible side effects associated with taking Stimulants. Combination of these drugs with other psychotropic medications could cause dizziness and drowsiness. Pt needs to Monitor symptoms and exercise caution in driving and operating heavy machinery to avoid damages to oneself, to others and to the surroundings. Patient verbalized understanding in this matter. Dependence and abuse for these drugs will be monitored closely. A Controlled substance policy and procedure is on file which allows [facility] to order a urine drug screen test at any visit. Patient understands and agrees with the plan..  This patient was seen by 10/22/2019 FNP Collaboration with Dr Monte Gil as a part of collaborative care agreement  Meds ordered this encounter  Medications  . DISCONTD: amphetamine-dextroamphetamine (ADDERALL) 20 MG tablet    Sig: Take 1 tablet (20 mg  total) by mouth 2 (two) times daily.    Dispense:  60 tablet    Refill:  0    Order Specific Question:   Supervising Provider    Answer:   Lyndon Code [[id number]]  . DISCONTD: amphetamine-dextroamphetamine (ADDERALL) 20 MG tablet    Sig: Take 1 tablet (20 mg total) by mouth 2 (two) times daily.    Dispense:  60 tablet    Refill:  0    Fill after 09/21/2019    Order Specific Question:   Supervising Provider    Answer:   Lyndon Code [[id number]]  . amphetamine-dextroamphetamine (ADDERALL) 20 MG tablet    Sig: Take 1 tablet (20 mg total) by mouth 2 (two) times daily.    Dispense:  60 tablet    Refill:  0    Fill after 10/20/2019    Order Specific Question:   Supervising Provider    Answer:   Lyndon Code [[id number]]    Time spent: 25 Minutes    Dr Lyndon Code Internal medicine

## 2019-11-24 ENCOUNTER — Encounter: Payer: Self-pay | Admitting: Nurse Practitioner

## 2019-11-24 ENCOUNTER — Ambulatory Visit: Payer: Medicaid Other | Admitting: Nurse Practitioner

## 2019-11-24 VITALS — Resp 16 | Ht 69.0 in | Wt 163.0 lb

## 2019-11-24 DIAGNOSIS — L732 Hidradenitis suppurativa: Secondary | ICD-10-CM

## 2019-11-24 DIAGNOSIS — R41840 Attention and concentration deficit: Secondary | ICD-10-CM | POA: Diagnosis not present

## 2019-11-24 MED ORDER — AMPHETAMINE-DEXTROAMPHETAMINE 15 MG PO TABS: 15.0000 mg | ORAL_TABLET | Freq: Two times a day (BID) | ORAL | 0 refills | Status: DC

## 2019-11-24 NOTE — Progress Notes (Signed)
[facility] [address] [place], [place] [address]  Internal MEDICINE  Telephone Visit  Patient Name: Luis Simon  [id number]  [id number]  Date of Service: 12/10/2019  I connected with the patient at 12:46pm by telephone and verified the patients identity using two identifiers.   I discussed the limitations, risks, security and privacy concerns of performing an evaluation and management service by telephone and the availability of in person appointments. I also discussed with the patient that there may be a patient responsible charge related to the service.  The patient expressed understanding and agrees to proceed.    Chief Complaint  Patient presents with  . Medication Refill    [phone]     The patient has been contacted via telephone for follow up visit due to concerns for spread of novel coronavirus. He presents for follow up visit. He is currently living in [place] for school. He just started summer session. Only has eight more classes to go before he finishes his bachelor's degree. States that he was on Dean's list this past spring. He is handing in assignments on time and able to pay attention in class. Finds it difficult to keep schedule straight all the time. With changing precautions due to COVID 19, classes keep changing between in class and virtual learning platforms. We discussed risks of taking adderall for long term. We did try different dosing inf vyvanse in the past. Found that on longer days, he was getting very fatigued before classes were over and on short days, he did not need the extended control of the medication.       Current Medication: Outpatient Encounter Medications as of 11/24/2019  Medication Sig  . amphetamine-dextroamphetamine (ADDERALL) 15 MG tablet Take 1 tablet by mouth 2 (two) times daily.  . Clindamycin-Benzoyl Per, Refr, gel Apply topically twice daily  . Folic Acid 5 MG CAPS Take 1 capsule (5 mg total) by mouth daily.   . methotrexate (RHEUMATREX) 2.5 MG tablet Take 3 tablets (7.5 mg total) by mouth once a week.  . [DISCONTINUED] amphetamine-dextroamphetamine (ADDERALL) 20 MG tablet Take 1 tablet (20 mg total) by mouth 2 (two) times daily.   No facility-administered encounter medications on file as of 11/24/2019.    Surgical History: Past Surgical History:  Procedure Laterality Date  . none    . SKIN SURGERY Right    right arm anfd left groin    Medical History: Past Medical History:  Diagnosis Date  . ADHD     Family History: Family History  Family history unknown: Yes    Social History   Socioeconomic History  . Marital status: Single    Spouse name: Not on file  . Number of children: Not on file  . Years of education: Not on file  . Highest education level: Not on file  Occupational History  . Not on file  Tobacco Use  . Smoking status: Never Smoker  . Smokeless tobacco: Never Used  Vaping Use  . Vaping Use: Never used  Substance and Sexual Activity  . Alcohol use: No  . Drug use: No  . Sexual activity: Not on file  Other Topics Concern  . Not on file  Social History Narrative  . Not on file   Social Determinants of Health   Financial Resource Strain:   . Difficulty of Paying Living Expenses: Not on file  Food Insecurity:   . Worried About Programme researcher, broadcasting/film/video in the Last Year: Not on file  .  Ran Out of Food in the Last Year: Not on file  Transportation Needs:   . Lack of Transportation (Medical): Not on file  . Lack of Transportation (Non-Medical): Not on file  Physical Activity:   . Days of Exercise per Week: Not on file  . Minutes of Exercise per Session: Not on file  Stress:   . Feeling of Stress : Not on file  Social Connections:   . Frequency of Communication with Friends and Family: Not on file  . Frequency of Social Gatherings with Friends and Family: Not on file  . Attends Religious Services: Not on file  . Active Member of Clubs or Organizations: Not  on file  . Attends Banker Meetings: Not on file  . Marital Status: Not on file  Intimate Partner Violence:   . Fear of Current or Ex-Partner: Not on file  . Emotionally Abused: Not on file  . Physically Abused: Not on file  . Sexually Abused: Not on file      Review of Systems  Constitutional: Negative for activity change, chills, fatigue and unexpected weight change.  HENT: Negative for congestion, postnasal drip, rhinorrhea, sneezing and sore throat.   Respiratory: Negative for cough, chest tightness and shortness of breath.   Cardiovascular: Negative for chest pain and palpitations.  Gastrointestinal: Negative for abdominal distention, abdominal pain, constipation, diarrhea, nausea and vomiting.  Endocrine: Negative for cold intolerance, heat intolerance, polydipsia and polyuria.  Skin: Negative for rash.  Allergic/Immunologic: Positive for environmental allergies.  Neurological: Negative for tremors and headaches.  Hematological: Negative for adenopathy. Does not bruise/bleed easily.  Psychiatric/Behavioral: Positive for decreased concentration. Negative for behavioral problems (Depression), sleep disturbance and suicidal ideas. The patient is not nervous/anxious.     Today's Vitals   11/24/19 1142  Resp: 16  Weight: 163 lb (73.9 kg)  Height: 5\' 9"  (1.753 m)   Body mass index is 24.07 kg/m.  Observation/Objective:  The patient is alert and oriented. He is pleasant and answering all questions appropriately. Breathing is non-labored. He is in no acute distress.    Assessment/Plan: 1. Hidradenitis suppurativa Continue regular visits with dermatology as scheduled.   2. Attention and concentration deficit Decreased dose adderall to 15mg  up to twice daily as needed for focus and concentration. Single 30 day prescription sent to his pharmacy. Patient will need to be seen in office for next visit. He voiced understanding and aggreement with the plan.  -  amphetamine-dextroamphetamine (ADDERALL) 15 MG tablet; Take 1 tablet by mouth 2 (two) times daily.  Dispense: 60 tablet; Refill: 0  General Counseling: Montrae verbalizes understanding of the findings of today's phone visit and agrees with plan of treatment. I have discussed any further diagnostic evaluation that may be needed or ordered today. We also reviewed his medications today. he has been encouraged to call the office with any questions or concerns that should arise related to todays visit.   Refilled Controlled medications today. Reviewed risks and possible side effects associated with taking Stimulants. Combination of these drugs with other psychotropic medications could cause dizziness and drowsiness. Pt needs to Monitor symptoms and exercise caution in driving and operating heavy machinery to avoid damages to oneself, to others and to the surroundings. Patient verbalized understanding in this matter. Dependence and abuse for these drugs will be monitored closely. A Controlled substance policy and procedure is on file which allows [facility] to order a urine drug screen test at any visit. Patient understands and agrees with the  plan..  This patient was seen by Vincent Gros FNP Collaboration with Dr Lyndon Code as a part of collaborative care agreement  Meds ordered this encounter  Medications  . amphetamine-dextroamphetamine (ADDERALL) 15 MG tablet    Sig: Take 1 tablet by mouth 2 (two) times daily.    Dispense:  60 tablet    Refill:  0    Order Specific Question:   Supervising Provider    Answer:   Lyndon Code [[id number]]    Time spent: 78 Minutes    Dr Lyndon Code Internal medicine

## 2019-12-25 ENCOUNTER — Ambulatory Visit: Payer: Medicaid Other | Admitting: Nurse Practitioner

## 2020-01-01 ENCOUNTER — Telehealth: Payer: Self-pay

## 2020-01-01 DIAGNOSIS — R41840 Attention and concentration deficit: Secondary | ICD-10-CM

## 2020-01-01 MED ORDER — AMPHETAMINE-DEXTROAMPHETAMINE 15 MG PO TABS: ORAL_TABLET | ORAL | 0 refills | Status: DC

## 2020-01-01 NOTE — Telephone Encounter (Signed)
done

## 2020-02-09 ENCOUNTER — Ambulatory Visit: Payer: Medicaid Other | Admitting: Nurse Practitioner

## 2020-02-12 ENCOUNTER — Other Ambulatory Visit: Payer: Self-pay

## 2020-02-12 ENCOUNTER — Encounter: Payer: Self-pay | Admitting: Nurse Practitioner

## 2020-02-12 ENCOUNTER — Ambulatory Visit: Payer: Medicaid Other | Admitting: Nurse Practitioner

## 2020-02-12 VITALS — BP 128/82 | HR 98 | Temp 97.4°F | Resp 16 | Ht 69.0 in | Wt 161.8 lb

## 2020-02-12 DIAGNOSIS — L732 Hidradenitis suppurativa: Secondary | ICD-10-CM | POA: Diagnosis not present

## 2020-02-12 DIAGNOSIS — R41840 Attention and concentration deficit: Secondary | ICD-10-CM | POA: Diagnosis not present

## 2020-02-12 DIAGNOSIS — Z79899 Other long term (current) drug therapy: Secondary | ICD-10-CM | POA: Diagnosis not present

## 2020-02-12 LAB — POCT URINE DRUG SCREEN
Methylenedioxyamphetamine: NOT DETECTED
POC Amphetamine UR: POSITIVE — AB
POC BENZODIAZEPINES UR: NOT DETECTED
POC Barbiturate UR: NOT DETECTED
POC Cocaine UR: NOT DETECTED
POC Ecstasy UR: NOT DETECTED
POC Marijuana UR: POSITIVE — AB
POC Methadone UR: NOT DETECTED
POC Methamphetamine UR: NOT DETECTED
POC Opiate Ur: NOT DETECTED
POC Oxycodone UR: NOT DETECTED
POC PHENCYCLIDINE UR: NOT DETECTED
POC TRICYCLICS UR: NOT DETECTED

## 2020-02-12 MED ORDER — AMPHETAMINE-DEXTROAMPHETAMINE 15 MG PO TABS: ORAL_TABLET | ORAL | 0 refills | Status: DC

## 2020-02-12 NOTE — Progress Notes (Signed)
[facility] [address] [place], [place] [address]  Internal MEDICINE  Office Visit Note  Patient Name: Luis Simon  [id number]  [id number]  Date of Service: 03/17/2020  Chief Complaint  Patient presents with  . Follow-up    Refill request  . ADHD    The patient is here for routine follow.  -lowered dose adderall to 15mg  twice daily at last visit. Doing well with this dosing. He states that hs is completing assignments on time, paying attention in class, and is earning very good grades in college.  -Conner's self assessment for adult ADD administered today. Patient scored 4/6 indicating positive for sdult ADD.  -uses Delta 8 disposable cartridges at night to help with sleep. - because of this, UDS positive for THC.       Current Medication: Outpatient Encounter Medications as of 02/12/2020  Medication Sig  . Clindamycin-Benzoyl Per, Refr, gel Apply topically twice daily  . Folic Acid 5 MG CAPS Take 1 capsule (5 mg total) by mouth daily.  . methotrexate (RHEUMATREX) 2.5 MG tablet Take 3 tablets (7.5 mg total) by mouth once a week.  . [DISCONTINUED] amphetamine-dextroamphetamine (ADDERALL) 15 MG tablet One tab po bid as needed for attention,  . amphetamine-dextroamphetamine (ADDERALL) 15 MG tablet One tab po bid as needed for attention,  . [DISCONTINUED] amphetamine-dextroamphetamine (ADDERALL) 15 MG tablet One tab po bid as needed for attention,  . [DISCONTINUED] amphetamine-dextroamphetamine (ADDERALL) 15 MG tablet One tab po bid as needed for attention,   No facility-administered encounter medications on file as of 02/12/2020.    Surgical History: Past Surgical History:  Procedure Laterality Date  . none    . SKIN SURGERY Right    right arm anfd left groin    Medical History: Past Medical History:  Diagnosis Date  . ADHD     Family History: Family History  Family history unknown: Yes    Social History   Socioeconomic History  . Marital  status: Single    Spouse name: Not on file  . Number of children: Not on file  . Years of education: Not on file  . Highest education level: Not on file  Occupational History  . Not on file  Tobacco Use  . Smoking status: Never Smoker  . Smokeless tobacco: Never Used  Vaping Use  . Vaping Use: Never used  Substance and Sexual Activity  . Alcohol use: No  . Drug use: No  . Sexual activity: Not on file  Other Topics Concern  . Not on file  Social History Narrative  . Not on file   Social Determinants of Health   Financial Resource Strain: Not on file  Food Insecurity: Not on file  Transportation Needs: Not on file  Physical Activity: Not on file  Stress: Not on file  Social Connections: Not on file  Intimate Partner Violence: Not on file      Review of Systems  Constitutional: Negative for activity change, chills, fatigue and unexpected weight change.  HENT: Negative for congestion, postnasal drip, rhinorrhea, sneezing and sore throat.   Respiratory: Negative for cough, chest tightness and shortness of breath.   Cardiovascular: Negative for chest pain and palpitations.  Gastrointestinal: Negative for abdominal distention, abdominal pain, constipation, diarrhea, nausea and vomiting.  Endocrine: Negative for cold intolerance, heat intolerance, polydipsia and polyuria.  Skin: Negative for rash.  Allergic/Immunologic: Positive for environmental allergies.  Neurological: Negative for tremors and headaches.  Hematological: Negative for adenopathy. Does not bruise/bleed easily.  Psychiatric/Behavioral: Positive for decreased concentration and sleep disturbance. Negative for behavioral problems (Depression) and suicidal ideas. The patient is not nervous/anxious.     Today's Vitals   02/12/20 1606  BP: 128/82  Pulse: 98  Resp: 16  Temp: (!) 97.4 F (36.3 C)  SpO2: 99%  Weight: 161 lb 12.8 oz (73.4 kg)  Height: 5\' 9"  (1.753 m)   Body mass index is 23.89  kg/m.  Physical Exam Vitals and nursing note reviewed.  Constitutional:      General: He is not in acute distress.    Appearance: Normal appearance. He is well-developed. He is not diaphoretic.  HENT:     Head: Normocephalic and atraumatic.     Mouth/Throat:     Pharynx: No oropharyngeal exudate.  Eyes:     Pupils: Pupils are equal, round, and reactive to light.  Cardiovascular:     Rate and Rhythm: Normal rate and regular rhythm.     Heart sounds: Normal heart sounds. No murmur heard. No friction rub. No gallop.   Pulmonary:     Effort: Pulmonary effort is normal. No respiratory distress.     Breath sounds: Normal breath sounds. No wheezing or rales.  Chest:     Chest wall: No tenderness.  Abdominal:     Tenderness: There is no abdominal tenderness.  Musculoskeletal:     Cervical back: Normal range of motion and neck supple.  Skin:    General: Skin is warm and dry.     Comments: Mild facial acne on forehead and chin. No evidence of inflammation or cellulitis at this time.  Neurological:     Mental Status: He is alert and oriented to person, place, and time.     Cranial Nerves: No cranial nerve deficit.  Psychiatric:        Behavior: Behavior normal.        Thought Content: Thought content normal.        Judgment: Judgment normal.     Comments: Patient administered a Conner's self assessment for adult ADD. Scored 4/6    Assessment/Plan: 1. Hidradenitis suppurativa Continue regualr visits with rheumatology/dermatology as scheduled  2. Attention and concentration deficit Patient scored 4/6 on Conner's self-assessment for adult ADD. Will renew prescription for adderall 15mg  up to twice daily as needed for focus and concentration. He is generally using only 1/2 tablet in afternoons. Three 30 day prescriptions for #45 tablets were sent to his pharmacy today. Dates on prescriptions are 02/11/2020, 03/13/2019, and 04/10/2020 - amphetamine-dextroamphetamine (ADDERALL) 15 MG tablet;  One tab po bid as needed for attention,  Dispense: 45 tablet; Refill: 0  3. Encounter for long-term (current) use of medications UDS positive for AMP and THC. Patient currently using Delta 8 at night to help him sleep and likely cause of positive THC on UDS. Patient does not mix adderall with the Delta *.  - POCT Urine Drug Screen  General Counseling: Jeziah verbalizes understanding of the findings of todays visit and agrees with plan of treatment. I have discussed any further diagnostic evaluation that may be needed or ordered today. We also reviewed his medications today. he has been encouraged to call the office with any questions or concerns that should arise related to todays visit.  Refilled Controlled medications today. Reviewed risks and possible side effects associated with taking Stimulants. Combination of these drugs with other psychotropic medications could cause dizziness and drowsiness. Pt needs to Monitor symptoms and exercise caution in driving and operating heavy machinery to avoid damages  to oneself, to others and to the surroundings. Patient verbalized understanding in this matter. Dependence and abuse for these drugs will be monitored closely. A Controlled substance policy and procedure is on file which allows [facility] to order a urine drug screen test at any visit. Patient understands and agrees with the plan..  This patient was seen by Vincent Gros FNP Collaboration with Dr Lyndon Code as a part of collaborative care agreement  Orders Placed This Encounter  Procedures  . POCT Urine Drug Screen    Meds ordered this encounter  Medications  . DISCONTD: amphetamine-dextroamphetamine (ADDERALL) 15 MG tablet    Sig: One tab po bid as needed for attention,    Dispense:  45 tablet    Refill:  0    Order Specific Question:   Supervising Provider    Answer:   Lyndon Code [[id number]]  . DISCONTD: amphetamine-dextroamphetamine (ADDERALL) 15 MG tablet    Sig: One tab  po bid as needed for attention,    Dispense:  45 tablet    Refill:  0    Fill after 03/12/2020    Order Specific Question:   Supervising Provider    Answer:   Lyndon Code [[id number]]  . amphetamine-dextroamphetamine (ADDERALL) 15 MG tablet    Sig: One tab po bid as needed for attention,    Dispense:  45 tablet    Refill:  0    Fill after 04/10/2020    Order Specific Question:   Supervising Provider    Answer:   Lyndon Code [[id number]]    Total time spent: 30 Minutes   Time spent includes review of chart, medications, test results, and follow up plan with the patient.      Dr Lyndon Code Internal medicine

## 2020-03-19 ENCOUNTER — Ambulatory Visit: Admit: 2020-03-19 | Discharge: 2020-03-20 | Payer: BLUE CROSS/BLUE SHIELD

## 2020-03-19 DIAGNOSIS — L732 Hidradenitis suppurativa: Principal | ICD-10-CM

## 2020-03-19 DIAGNOSIS — Z79899 Other long term (current) drug therapy: Principal | ICD-10-CM

## 2020-03-21 DIAGNOSIS — L732 Hidradenitis suppurativa: Principal | ICD-10-CM

## 2020-03-21 MED ORDER — FOLIC ACID 1 MG TABLET
ORAL_TABLET | Freq: Every day | ORAL | 3 refills | 90.00000 days | Status: CP
Start: 2020-03-21 — End: 2021-03-21

## 2020-03-21 MED ORDER — METHOTREXATE SODIUM 2.5 MG TABLET
ORAL_TABLET | ORAL | 4 refills | 28.00000 days | Status: CP
Start: 2020-03-21 — End: 2020-04-20

## 2020-04-12 ENCOUNTER — Ambulatory Visit: Admit: 2020-04-12 | Discharge: 2020-04-13 | Payer: BLUE CROSS/BLUE SHIELD

## 2020-04-12 DIAGNOSIS — L732 Hidradenitis suppurativa: Principal | ICD-10-CM

## 2020-04-25 ENCOUNTER — Ambulatory Visit: Admit: 2020-04-25 | Discharge: 2020-04-26 | Payer: BLUE CROSS/BLUE SHIELD

## 2020-05-13 ENCOUNTER — Ambulatory Visit: Payer: Medicaid Other | Admitting: Physician Assistant

## 2020-05-13 ENCOUNTER — Encounter: Payer: Self-pay | Admitting: Physician Assistant

## 2020-05-13 DIAGNOSIS — L732 Hidradenitis suppurativa: Secondary | ICD-10-CM | POA: Diagnosis not present

## 2020-05-13 DIAGNOSIS — R41840 Attention and concentration deficit: Secondary | ICD-10-CM | POA: Diagnosis not present

## 2020-05-13 MED ORDER — AMPHETAMINE-DEXTROAMPHETAMINE 15 MG PO TABS: ORAL_TABLET | ORAL | 0 refills | Status: DC

## 2020-05-13 NOTE — Progress Notes (Signed)
[facility] [address] [place], [place] [address]  Internal MEDICINE  Telephone Visit  Patient Name: Luis Simon  [id number]  [id number]  Date of Service: 05/14/2020  I connected with the patient at 3:34 by telephone and verified the patients identity using two identifiers.   I discussed the limitations, risks, security and privacy concerns of performing an evaluation and management service by telephone and the availability of in person appointments. I also discussed with the patient that there may be a patient responsible charge related to the service.  The patient expressed understanding and agrees to proceed.    Chief Complaint  Patient presents with  . Laryngitis  . Telephone Screen  . Telephone Assessment    [phone]  . ADHD    HPI Pt is here for f/u. He has no complaints today, just needs refill of his adderall. -He takes adderall every other day, unless he has additional work to do. Usually takes 2 per day when he does take it.  He does work on weekends and will sometimes need to take it.  -Just finished break so readjusting. He graduates in 2 months. -He sees rheumatology regularly. He gets infusions for HS. Just started back up with them earlier this year. Had taken a little break from it. -Sleep is good. Gets at least 7 hours per night.  Current Medication: Outpatient Encounter Medications as of 05/13/2020  Medication Sig  . Clindamycin-Benzoyl Per, Refr, gel Apply topically twice daily  . Folic Acid 5 MG CAPS Take 1 capsule (5 mg total) by mouth daily.  . methotrexate (RHEUMATREX) 2.5 MG tablet Take 3 tablets (7.5 mg total) by mouth once a week.  . [DISCONTINUED] amphetamine-dextroamphetamine (ADDERALL) 15 MG tablet One tab po bid as needed for attention,  . amphetamine-dextroamphetamine (ADDERALL) 15 MG tablet One tab po bid as needed for attention,   No facility-administered encounter medications on file as of 05/13/2020.    Surgical  History: Past Surgical History:  Procedure Laterality Date  . none    . SKIN SURGERY Right    right arm anfd left groin    Medical History: Past Medical History:  Diagnosis Date  . ADHD     Family History: Family History  Family history unknown: Yes    Social History   Socioeconomic History  . Marital status: Single    Spouse name: Not on file  . Number of children: Not on file  . Years of education: Not on file  . Highest education level: Not on file  Occupational History  . Not on file  Tobacco Use  . Smoking status: Never Smoker  . Smokeless tobacco: Never Used  Vaping Use  . Vaping Use: Never used  Substance and Sexual Activity  . Alcohol use: No  . Drug use: No  . Sexual activity: Not on file  Other Topics Concern  . Not on file  Social History Narrative  . Not on file   Social Determinants of Health   Financial Resource Strain: Not on file  Food Insecurity: Not on file  Transportation Needs: Not on file  Physical Activity: Not on file  Stress: Not on file  Social Connections: Not on file  Intimate Partner Violence: Not on file      Review of Systems  Constitutional: Negative for chills, fatigue and unexpected weight change.  HENT: Negative for congestion, postnasal drip, rhinorrhea, sneezing and sore throat.   Eyes: Negative for redness.  Respiratory: Negative for cough, chest tightness and  shortness of breath.   Cardiovascular: Negative for chest pain and palpitations.  Gastrointestinal: Negative for abdominal pain, constipation, diarrhea, nausea and vomiting.  Genitourinary: Negative for dysuria and frequency.  Musculoskeletal: Negative for arthralgias, back pain, joint swelling and neck pain.  Skin: Negative for rash.  Neurological: Negative.  Negative for tremors and numbness.  Hematological: Negative for adenopathy. Does not bruise/bleed easily.  Psychiatric/Behavioral: Positive for decreased concentration. Negative for behavioral  problems (Depression), sleep disturbance and suicidal ideas. The patient is not nervous/anxious.     Vital Signs: Ht 5\' 9"  (1.753 m)   Wt 160 lb (72.6 kg)   BMI 23.63 kg/m    Observation/Objective:  Pt can carry out conversation.   Assessment/Plan: 1. Attention and concentration deficit Pt may continue to take 1-2 tabs of Adderall as needed for attention. He will skip days when he doesn't have as much work to get done. - amphetamine-dextroamphetamine (ADDERALL) 15 MG tablet; One tab po bid as needed for attention,  Dispense: 45 tablet; Refill: 0  2. Hidradenitis suppurativa Followed by rheumatology. He is receiving infusion treatment currently and reports it is going well.   General Counseling: [name] verbalizes understanding of the findings of today's phone visit and agrees with plan of treatment. I have discussed any further diagnostic evaluation that may be needed or ordered today. We also reviewed his medications today. he has been encouraged to call the office with any questions or concerns that should arise related to todays visit.    No orders of the defined types were placed in this encounter.   Meds ordered this encounter  Medications  . amphetamine-dextroamphetamine (ADDERALL) 15 MG tablet    Sig: One tab po bid as needed for attention,    Dispense:  45 tablet    Refill:  0    Time spent: 25 Minutes    Dr 26 Internal medicine

## 2020-05-20 ENCOUNTER — Other Ambulatory Visit: Payer: Self-pay | Admitting: Internal Medicine

## 2020-05-20 ENCOUNTER — Telehealth: Payer: Self-pay

## 2020-05-20 DIAGNOSIS — R41840 Attention and concentration deficit: Secondary | ICD-10-CM

## 2020-05-20 MED ORDER — AMPHETAMINE-DEXTROAMPHETAMINE 15 MG PO TABS: ORAL_TABLET | ORAL | 0 refills | Status: DC

## 2020-05-20 NOTE — Telephone Encounter (Signed)
Please make sure it went to the right pharmacy

## 2020-05-20 NOTE — Telephone Encounter (Signed)
Spoke with pt's mother and informed her pt can check with the pharmacy to pick up meds, script was resent.

## 2020-06-16 ENCOUNTER — Other Ambulatory Visit: Payer: Self-pay | Admitting: Nurse Practitioner

## 2020-06-16 DIAGNOSIS — L732 Hidradenitis suppurativa: Secondary | ICD-10-CM

## 2020-06-17 ENCOUNTER — Other Ambulatory Visit: Payer: Self-pay | Admitting: Internal Medicine

## 2020-06-17 ENCOUNTER — Telehealth: Payer: Self-pay

## 2020-06-17 DIAGNOSIS — R41840 Attention and concentration deficit: Secondary | ICD-10-CM

## 2020-06-17 MED ORDER — AMPHETAMINE-DEXTROAMPHETAMINE 15 MG PO TABS: ORAL_TABLET | ORAL | 0 refills | Status: DC

## 2020-06-19 NOTE — Telephone Encounter (Signed)
Refill for Adderall was sent

## 2020-07-14 DIAGNOSIS — L732 Hidradenitis suppurativa: Principal | ICD-10-CM

## 2020-08-08 ENCOUNTER — Ambulatory Visit: Payer: Medicaid Other | Admitting: Physician Assistant

## 2020-08-26 ENCOUNTER — Encounter: Payer: Self-pay | Admitting: Physician Assistant

## 2020-08-26 ENCOUNTER — Other Ambulatory Visit: Payer: Self-pay

## 2020-08-26 ENCOUNTER — Ambulatory Visit: Payer: Medicaid Other | Admitting: Physician Assistant

## 2020-08-26 DIAGNOSIS — Z79899 Other long term (current) drug therapy: Secondary | ICD-10-CM | POA: Diagnosis not present

## 2020-08-26 DIAGNOSIS — R41840 Attention and concentration deficit: Secondary | ICD-10-CM

## 2020-08-26 LAB — POCT URINE DRUG SCREEN
Methylenedioxyamphetamine: NOT DETECTED
POC Amphetamine UR: NOT DETECTED
POC BENZODIAZEPINES UR: NOT DETECTED
POC Barbiturate UR: NOT DETECTED
POC Cocaine UR: NOT DETECTED
POC Ecstasy UR: NOT DETECTED
POC Marijuana UR: POSITIVE — AB
POC Methadone UR: NOT DETECTED
POC Methamphetamine UR: NOT DETECTED
POC Opiate Ur: NOT DETECTED
POC Oxycodone UR: NOT DETECTED
POC PHENCYCLIDINE UR: NOT DETECTED
POC TRICYCLICS UR: NOT DETECTED

## 2020-08-26 MED ORDER — AMPHETAMINE-DEXTROAMPHETAMINE 15 MG PO TABS: ORAL_TABLET | ORAL | 0 refills | Status: DC

## 2020-08-26 NOTE — Progress Notes (Signed)
[facility] [address] [place], [place] [address]  Internal MEDICINE  Office Visit Note  Patient Name: Luis Simon  [id number]  [id number]  Date of Service: 08/27/2020  Chief Complaint  Patient presents with   Follow-up   Quality Metric Gaps    pneumovax    HPI Pt is here for routine follow up and med refill -Just finished school -Planning to start working in [place] or [place] depending on how he does in an upcoming program in Oct -Planning to travel for birthday soon, but unsure where -usually takes 1 tab of Adderall, then sometimes takes 1/2-1 tab again in afternoon if needed. Tries to take breaks and did not take medication today -Planning to study for series7 for maybe 2 months and will need his medication for the days he is studying. Takes the exam in sept -Denies any difficulty sleeping or heart racing.  -Denies smoking marijuana, but will take CBD gummies/delta-8 up to 3days per week  Current Medication: Outpatient Encounter Medications as of 08/26/2020  Medication Sig   Clindamycin-Benzoyl Per, Refr, gel Apply topically twice daily   Folic Acid 5 MG CAPS Take 1 capsule (5 mg total) by mouth daily.   methotrexate (RHEUMATREX) 2.5 MG tablet Take 3 tablets (7.5 mg total) by mouth once a week.   [DISCONTINUED] amphetamine-dextroamphetamine (ADDERALL) 15 MG tablet One tab po bid as needed for attention,   [DISCONTINUED] amphetamine-dextroamphetamine (ADDERALL) 15 MG tablet One tab po bid as needed for attention,   No facility-administered encounter medications on file as of 08/26/2020.    Surgical History: Past Surgical History:  Procedure Laterality Date   none     SKIN SURGERY Right    right arm anfd left groin    Medical History: Past Medical History:  Diagnosis Date   ADHD     Family History: Family History  Family history unknown: Yes    Social History   Socioeconomic History   Marital status: Single    Spouse name: Not on  file   Number of children: Not on file   Years of education: Not on file   Highest education level: Not on file  Occupational History   Not on file  Tobacco Use   Smoking status: Never   Smokeless tobacco: Never  Vaping Use   Vaping Use: Never used  Substance and Sexual Activity   Alcohol use: No   Drug use: No   Sexual activity: Not on file  Other Topics Concern   Not on file  Social History Narrative   Not on file   Social Determinants of Health   Financial Resource Strain: Not on file  Food Insecurity: Not on file  Transportation Needs: Not on file  Physical Activity: Not on file  Stress: Not on file  Social Connections: Not on file  Intimate Partner Violence: Not on file      Review of Systems  Constitutional:  Negative for chills, fatigue and unexpected weight change.  HENT:  Negative for congestion, postnasal drip, rhinorrhea, sneezing and sore throat.   Eyes:  Negative for redness.  Respiratory:  Negative for cough, chest tightness and shortness of breath.   Cardiovascular:  Negative for chest pain and palpitations.  Gastrointestinal:  Negative for abdominal pain, constipation, diarrhea, nausea and vomiting.  Genitourinary:  Negative for dysuria and frequency.  Musculoskeletal:  Negative for arthralgias, back pain, joint swelling and neck pain.  Skin:  Negative for rash.  Neurological: Negative.  Negative for tremors and numbness.  Hematological:  Negative for adenopathy. Does not bruise/bleed easily.  Psychiatric/Behavioral:  Positive for decreased concentration. Negative for behavioral problems (Depression), sleep disturbance and suicidal ideas. The patient is not nervous/anxious.    Vital Signs: BP 114/62   Pulse 67   Temp (!) 97.2 F (36.2 C)   Resp 16   Ht 5\' 9"  (1.753 m)   Wt 160 lb 6.4 oz (72.8 kg)   SpO2 98%   BMI 23.69 kg/m    Physical Exam Vitals and nursing note reviewed.  Constitutional:      General: He is not in acute distress.     Appearance: He is well-developed and normal weight. He is not diaphoretic.  HENT:     Head: Normocephalic and atraumatic.     Mouth/Throat:     Pharynx: No oropharyngeal exudate.  Eyes:     Pupils: Pupils are equal, round, and reactive to light.  Neck:     Thyroid: No thyromegaly.     Vascular: No JVD.     Trachea: No tracheal deviation.  Cardiovascular:     Rate and Rhythm: Normal rate and regular rhythm.     Heart sounds: Normal heart sounds. No murmur heard.   No friction rub. No gallop.  Pulmonary:     Effort: Pulmonary effort is normal. No respiratory distress.     Breath sounds: No wheezing or rales.  Chest:     Chest wall: No tenderness.  Abdominal:     General: Bowel sounds are normal.     Palpations: Abdomen is soft.  Musculoskeletal:        General: Normal range of motion.     Cervical back: Normal range of motion and neck supple.  Lymphadenopathy:     Cervical: No cervical adenopathy.  Skin:    General: Skin is warm and dry.  Neurological:     Mental Status: He is alert and oriented to person, place, and time.     Cranial Nerves: No cranial nerve deficit.  Psychiatric:        Behavior: Behavior normal.        Thought Content: Thought content normal.        Judgment: Judgment normal.       Assessment/Plan: 1. Attention and concentration deficit May continue adderall as needed, encouraged to keep taking breaks especially [place] in [place] before studying/working begins  2. Encounter for long-term (current) use of medications - POCT Urine Drug Screen   General Counseling: Eldred verbalizes understanding of the findings of todays visit and agrees with plan of treatment. I have discussed any further diagnostic evaluation that may be needed or ordered today. We also reviewed his medications today. he has been encouraged to call the office with any questions or concerns that should arise related to todays visit.    Orders Placed This Encounter   Procedures   POCT Urine Drug Screen    Meds ordered this encounter  Medications   DISCONTD: amphetamine-dextroamphetamine (ADDERALL) 15 MG tablet    Sig: One tab po bid as needed for attention,    Dispense:  45 tablet    Refill:  0     This patient was seen by , PA-C in collaboration with Dr. Lynn Ito as a part of collaborative care agreement.   Total time spent:30 Minutes Time spent includes review of chart, medications, test results, and follow up plan with the patient.      Dr Beverely Risen Internal medicine

## 2020-08-27 ENCOUNTER — Ambulatory Visit: Admit: 2020-08-27 | Discharge: 2020-08-28 | Payer: BLUE CROSS/BLUE SHIELD

## 2020-08-27 ENCOUNTER — Other Ambulatory Visit: Payer: Self-pay | Admitting: Internal Medicine

## 2020-08-27 DIAGNOSIS — L732 Hidradenitis suppurativa: Principal | ICD-10-CM

## 2020-08-27 DIAGNOSIS — R41840 Attention and concentration deficit: Secondary | ICD-10-CM

## 2020-08-27 MED ORDER — AMPHETAMINE-DEXTROAMPHETAMINE 15 MG PO TABS: ORAL_TABLET | ORAL | 0 refills | Status: DC

## 2020-09-27 ENCOUNTER — Emergency Department
Admission: EM | Admit: 2020-09-27 | Discharge: 2020-09-27 | Disposition: A | Discharge status: Home / Self Care | Payer: Medicaid Other | Attending: Emergency Medicine | Admitting: Emergency Medicine

## 2020-09-27 ENCOUNTER — Encounter: Payer: Self-pay | Admitting: Emergency Medicine

## 2020-09-27 ENCOUNTER — Other Ambulatory Visit: Payer: Self-pay

## 2020-09-27 ENCOUNTER — Emergency Department: Payer: Medicaid Other

## 2020-09-27 ENCOUNTER — Ambulatory Visit: Payer: Medicaid Other | Admitting: Physician Assistant

## 2020-09-27 DIAGNOSIS — W228XXA Striking against or struck by other objects, initial encounter: Secondary | ICD-10-CM | POA: Diagnosis not present

## 2020-09-27 DIAGNOSIS — N50811 Right testicular pain: Secondary | ICD-10-CM | POA: Diagnosis present

## 2020-09-27 DIAGNOSIS — N50812 Left testicular pain: Secondary | ICD-10-CM

## 2020-09-27 DIAGNOSIS — Y9367 Activity, basketball: Secondary | ICD-10-CM | POA: Insufficient documentation

## 2020-09-27 DIAGNOSIS — N5082 Scrotal pain: Secondary | ICD-10-CM | POA: Diagnosis not present

## 2020-09-27 DIAGNOSIS — N5089 Other specified disorders of the male genital organs: Secondary | ICD-10-CM

## 2020-09-27 DIAGNOSIS — N50819 Testicular pain, unspecified: Secondary | ICD-10-CM

## 2020-09-27 LAB — URINALYSIS, COMPLETE (UACMP) WITH MICROSCOPIC
Bacteria, UA: NONE SEEN
Bilirubin Urine: NEGATIVE
Glucose, UA: NEGATIVE mg/dL
Hgb urine dipstick: NEGATIVE
Ketones, ur: 20 mg/dL — AB
Leukocytes,Ua: NEGATIVE
Nitrite: NEGATIVE
Protein, ur: NEGATIVE mg/dL
Specific Gravity, Urine: 1.027 (ref 1.005–1.030)
Squamous Epithelial / HPF: NONE SEEN (ref 0–5)
pH: 5 (ref 5.0–8.0)

## 2020-09-27 NOTE — ED Triage Notes (Signed)
Pt to ER states that starting 2 days ago he has noted bilateral testicle pain.  Pt states he thought it was due to being hit during basketball.  Pt states pain continued throughout day yesterday and today.  Pt denies urinary problems.  Pain does not radiate to other areas.

## 2020-09-27 NOTE — ED Provider Notes (Signed)
[facility] Emergency Department Provider Note ____________________________________________   Event Date/Time   First MD Initiated Contact with Patient 09/27/20 1230     (approximate)  I have reviewed the triage vital signs and the nursing notes.  HISTORY  Chief Complaint Testicle Pain   HPI Luis Simon is a 25 y.o. malewho presents to the ED for evaluation of testicular or scrotal pain.  Chart review indicates no relevant history.  Patient presents to the ED, accompanied by his mother, for evaluation of about 4 days of scrotal pain.  He was triaged as having posterior back pain after being struck in the scrotum while playing basketball 4 days ago.  He tells me that he had no known trauma, injuries to his scrotum while playing basketball that day.  He does report the pain started atraumatically that evening after basketball.  Since that time, for the past few days, he has had persistent aching scrotal pain.  He reports the pain is midline, to the inferior aspect of his scrotum between his bilateral testicles.  He denies any difficulty urinating, dysuria, hematuria.  He denies any tumescence or ejaculation since that episode, and is therefore uncertain about any abnormalities related to this.  He reports he seems to be voiding at baseline.  He denies any penile discharge. Denies lower abdominal or inguinal pain.  Denies fevers, emesis.   Reporting 4-5/10 persistent aching discomfort to his scrotum.  This has never happened before.   Past Medical History:  Diagnosis Date   ADHD     Patient Active Problem List   Diagnosis Date Noted   Encounter for long-term (current) use of medications 05/28/2019   Non-seasonal allergic rhinitis 07/18/2018   Hidradenitis suppurativa 01/17/2018   Attention and concentration deficit 04/20/2017   Acne 12/27/2012    Past Surgical History:  Procedure Laterality Date   none     SKIN SURGERY Right    right arm  anfd left groin    Prior to Admission medications   Medication Sig Start Date End Date Taking? Authorizing Provider  amphetamine-dextroamphetamine (ADDERALL) 15 MG tablet One tab po bid as needed for attention, 08/27/20   Lyndon Code, MD  Clindamycin-Benzoyl Per, Refr, gel Apply topically twice daily 05/22/19   Carlean Jews, NP  Folic Acid 5 MG CAPS Take 1 capsule (5 mg total) by mouth daily. 12/02/18   Carlean Jews, NP  methotrexate (RHEUMATREX) 2.5 MG tablet Take 3 tablets (7.5 mg total) by mouth once a week. 12/02/18   Carlean Jews, NP    Allergies Patient has no known allergies.  Family History  Family history unknown: Yes    Social History Social History   Tobacco Use   Smoking status: Never   Smokeless tobacco: Never  Vaping Use   Vaping Use: Never used  Substance Use Topics   Alcohol use: No   Drug use: No    Review of Systems  Constitutional: No fever/chills Eyes: No visual changes. ENT: No sore throat. Cardiovascular: Denies chest pain. Respiratory: Denies shortness of breath. Gastrointestinal: No abdominal pain.  No nausea, no vomiting.  No diarrhea.  No constipation. Genitourinary: Negative for dysuria. Positive for atraumatic scrotal pain Musculoskeletal: Negative for back pain. Skin: Negative for rash. Neurological: Negative for headaches, focal weakness or numbness.  ____________________________________________   PHYSICAL EXAM:  VITAL SIGNS: Vitals:   09/27/20 1124 09/27/20 1436  BP: 121/81 129/69  Pulse: 76 73  Resp: 16 14  Temp: 100.1 F (37.8  C) 98.7 F (37.1 C)  SpO2: 97% 97%     Constitutional: Alert and oriented. Well appearing and in no acute distress. Eyes: Conjunctivae are normal. PERRL. EOMI. Head: Atraumatic. Nose: No congestion/rhinnorhea. Mouth/Throat: Mucous membranes are moist.  Oropharynx non-erythematous. Neck: No stridor. No cervical spine tenderness to palpation. Cardiovascular: Normal rate, regular  rhythm. Grossly normal heart sounds.  Good peripheral circulation. Respiratory: Normal respiratory effort.  No retractions. Lungs CTAB. Gastrointestinal: Soft , nondistended, nontender to palpation. No CVA tenderness. GU: chaperoned by Tammy Sours, RN.  Normal-appearing circumcised external genitalia.  No inguinal masses, tenderness.  No penile lesions, urethral discharge or tenderness to palpation to the penis. Normal-appearing scrotum without skin changes, erythema, induration, fluctuance, focal tenderness.  No tenderness to palpation to his site of pain. Bilateral descended testicles are smooth, round and without nodularity or tenderness. Cremasteric reflex intact bilaterally. Musculoskeletal: No lower extremity tenderness nor edema.  No joint effusions. No signs of acute trauma. Neurologic:  Normal speech and language. No gross focal neurologic deficits are appreciated. No gait instability noted. Skin:  Skin is warm, dry and intact. No rash noted. Psychiatric: Mood and affect are normal. Speech and behavior are normal.  ____________________________________________   LABS (all labs ordered are listed, but only abnormal results are displayed)  Labs Reviewed  URINALYSIS, COMPLETE (UACMP) WITH MICROSCOPIC - Abnormal; Notable for the following components:      Result Value   Color, Urine YELLOW (*)    APPearance CLEAR (*)    Ketones, ur 20 (*)    All other components within normal limits   ____________________________________________  12 Lead EKG   ____________________________________________  RADIOLOGY  ED MD interpretation:    Official radiology report(s): US SCROTUM W/DOPPLER  Result Date: 09/27/2020 CLINICAL DATA:  Scrotal swelling 2 days with pain EXAM: SCROTAL ULTRASOUND DOPPLER ULTRASOUND OF THE TESTICLES TECHNIQUE: Complete ultrasound examination of the testicles, epididymis, and other scrotal structures was performed. Color and spectral Doppler ultrasound were also utilized  to evaluate blood flow to the testicles. COMPARISON:  None. FINDINGS: Right testicle Measurements: 4.7 x 2.1 x 2.7 cm. No mass or microlithiasis visualized. Left testicle Measurements: 5.0 x 2.2 x 2.8 cm. No mass or microlithiasis visualized. Right epididymis:  2 x 3 mm cyst. Left epididymis:  Normal in size and appearance. Hydrocele:  None visualized. Varicocele:  None visualized. Pulsed Doppler interrogation of both testes demonstrates normal low resistance arterial and venous waveforms bilaterally. IMPRESSION: Negative scrotal ultrasound. Electronically Signed   By: Marlan Palau M.D.   On: 09/27/2020 13:14    ____________________________________________   PROCEDURES and INTERVENTIONS  Procedure(s) performed (including Critical Care):  Procedures  Medications - No data to display  ____________________________________________   MDM / ED COURSE   Otherwise healthy 25 year old male presents to the ED with a few days of atraumatic scrotal pain of uncertain etiology and amenable to outpatient management with urology follow-up.  Normal vitals.  Exam generally reassuring.  Lower abdomen is benign.  No evidence of inguinal pathology.  I am unable to elicit any pathology on my examination to his scrotum or testicles at the site of pain.  Urinalysis is reassuring without infectious features.  Ultrasound of the scrotum is reassuring without evidence of testicular torsion, hydroceles or further intrascrotal derangements.  His pain is well controlled and I see no barriers to outpatient management at this time.  No indications for additional diagnostics here in the ED considering his good GU function.  We will discharge with information to see urology  in the clinic and return precautions for the ED.      ____________________________________________   FINAL CLINICAL IMPRESSION(S) / ED DIAGNOSES  Final diagnoses:  Pain in testicle, unspecified laterality     ED Discharge Orders     None         Kourtnee Lahey   Note:  This document was prepared using Dragon voice recognition software and may include unintentional dictation errors.    Delton Prairie, MD 09/27/20 [phone]

## 2020-09-27 NOTE — Discharge Instructions (Addendum)
Use Tylenol for pain and fevers.  Up to 1000 mg per dose, up to 4 times per day.  Do not take more than 4000 mg of Tylenol/acetaminophen within 24 hours..  Use naproxen/Aleve for anti-inflammatory pain relief. Use up to 500mg  every 12 hours. Do not take more frequently than this. Do not use other NSAIDs (ibuprofen, Advil) while taking this medication. It is safe to take Tylenol with this.   Call the Urologist to be seen in the clinic. If you have any sudden or severely worsening symptoms, fever with your symptoms, please return to the ED.

## 2020-09-27 NOTE — ED Notes (Signed)
Currently in ultrasound

## 2020-09-27 NOTE — ED Notes (Signed)
Scrotum exam completed by EDP , witnessed by this RN .

## 2020-10-04 ENCOUNTER — Ambulatory Visit: Payer: Medicaid Other | Admitting: Urology

## 2020-10-04 NOTE — Progress Notes (Deleted)
10/04/2020 8:31 AM   Kreg Shropshire Artelia Laroche 1996-01-28 [id number]  Referring provider: Carlean Jews, PA-C [address] [place],  [place] [address]  No chief complaint on file.   HPI: Luis Simon is a 25 y.o. male who presents for recent ED visit for scrotal pain.  Presented to [facility] 09/27/2020 with a 4-day history of scrotal pain which occurred after playing basketball Pain was located posterior scrotum in the midline Examination was unremarkable and urinalysis was clear Scrotal sonogram was performed which was unremarkable   PMH: Past Medical History:  Diagnosis Date   ADHD     Surgical History: Past Surgical History:  Procedure Laterality Date   none     SKIN SURGERY Right    right arm anfd left groin    Home Medications:  Allergies as of 10/04/2020   No Known Allergies      Medication List        Accurate as of October 04, 2020  8:31 AM. If you have any questions, ask your nurse or doctor.          amphetamine-dextroamphetamine 15 MG tablet Commonly known as: Adderall One tab po bid as needed for attention,   Clindamycin-Benzoyl Per (Refr) gel Apply topically twice daily   Folic Acid 5 MG Caps Take 1 capsule (5 mg total) by mouth daily.   methotrexate 2.5 MG tablet Commonly known as: RHEUMATREX Take 3 tablets (7.5 mg total) by mouth once a week.        Allergies: No Known Allergies  Family History: Family History  Family history unknown: Yes    Social History:  reports that he has never smoked. He has never used smokeless tobacco. He reports that he does not drink alcohol and does not use drugs.   Physical Exam: There were no vitals taken for this visit.  Constitutional:  Alert and oriented, No acute distress. HEENT: [place] AT, moist mucus membranes.  Trachea midline, no masses. Cardiovascular: No clubbing, cyanosis, or edema. Respiratory: Normal respiratory effort, no increased work of breathing. GI: Abdomen is soft, nontender,  nondistended, no abdominal masses GU: No CVA tenderness Lymph: No cervical or inguinal lymphadenopathy. Skin: No rashes, bruises or suspicious lesions. Neurologic: Grossly intact, no focal deficits, moving all 4 extremities. Psychiatric: Normal mood and affect.  Laboratory Data: No results found for: WBC, HGB, HCT, MCV, PLT  No results found for: CREATININE  No results found for: PSA  No results found for: TESTOSTERONE  No results found for: HGBA1C  Urinalysis    Component Value Date/Time   COLORURINE YELLOW (A) 09/27/2020 1137   APPEARANCEUR CLEAR (A) 09/27/2020 1137   LABSPEC 1.027 09/27/2020 1137   PHURINE 5.0 09/27/2020 1137   GLUCOSEU NEGATIVE 09/27/2020 1137   HGBUR NEGATIVE 09/27/2020 1137   BILIRUBINUR NEGATIVE 09/27/2020 1137   KETONESUR 20 (A) 09/27/2020 1137   PROTEINUR NEGATIVE 09/27/2020 1137   NITRITE NEGATIVE 09/27/2020 1137   LEUKOCYTESUR NEGATIVE 09/27/2020 1137    Lab Results  Component Value Date   BACTERIA NONE SEEN 09/27/2020    Pertinent Imaging: *** No results found for this or any previous visit.  No results found for this or any previous visit.  No results found for this or any previous visit.  No results found for this or any previous visit.  No results found for this or any previous visit.  No results found for this or any previous visit.  No results found for this or any previous visit.  No results  found for this or any previous visit.   Assessment & Plan:    There are no diagnoses linked to this encounter.  No follow-ups on file.  Riki Altes, MD  [facility] [address], [address] [place], [place] [address] [phone]

## 2020-10-07 ENCOUNTER — Other Ambulatory Visit: Payer: Self-pay

## 2020-10-07 ENCOUNTER — Encounter: Payer: Self-pay | Admitting: Urology

## 2020-10-07 ENCOUNTER — Ambulatory Visit: Payer: Medicaid Other | Admitting: Urology

## 2020-10-07 VITALS — BP 121/74 | HR 47 | Ht 69.0 in | Wt 160.0 lb

## 2020-10-07 DIAGNOSIS — N5082 Scrotal pain: Secondary | ICD-10-CM | POA: Diagnosis not present

## 2020-10-07 NOTE — Progress Notes (Signed)
10/07/2020 1:33 PM   Kreg Shropshire Artelia Laroche 18-Apr-1995 [id number]  Referring provider: Carlean Jews, PA-C [address] [place],  [place] [address]  Chief Complaint  Patient presents with   Testicle Pain   Groin Swelling    HPI: Luis Simon is a 25 y.o. male who presents for follow-up of a recent ED visit for scrotal pain.  Presented [facility] ED 09/27/2020 with a 2-day history of midline scrotal pain Gives a history of being hit in the scrotal area while playing basketball but had no pain and later that evening was complaining of a dull heavy sensation in the midline posterior scrotum Evaluation in the ED included a negative scrotal sonogram and negative urinalysis He had no voiding complaints or gross hematuria No prior history of urologic problems He states the day after discharge his pain was increased and characterized as a heavy sensation with increased intensity The area was nontender to touch Denied ejaculatory pain Pain has significantly improved and is presently minimal   PMH: Past Medical History:  Diagnosis Date   ADHD     Surgical History: Past Surgical History:  Procedure Laterality Date   none     SKIN SURGERY Right    right arm anfd left groin    Home Medications:  Allergies as of 10/07/2020   No Known Allergies      Medication List        Accurate as of October 07, 2020  1:33 PM. If you have any questions, ask your nurse or doctor.          STOP taking these medications    methotrexate 2.5 MG tablet Commonly known as: RHEUMATREX Stopped by: Riki Altes, MD       TAKE these medications    amphetamine-dextroamphetamine 15 MG tablet Commonly known as: Adderall One tab po bid as needed for attention,   Clindamycin-Benzoyl Per (Refr) gel Apply topically twice daily   folic acid 1 MG tablet Commonly known as: FOLVITE Take by mouth. What changed: Another medication with the same name was removed. Continue taking this  medication, and follow the directions you see here. Changed by: Riki Altes, MD   methotrexate 2.5 MG tablet Commonly known as: RHEUMATREX Take 15 mg by mouth once a week.        Allergies: No Known Allergies  Family History: Family History  Family history unknown: Yes    Social History:  reports that he has never smoked. He has never used smokeless tobacco. He reports that he does not drink alcohol and does not use drugs.   Physical Exam: BP 121/74 (BP Location: Left Arm, Patient Position: Sitting, Cuff Size: Normal)   Pulse (!) 47   Ht 5\' 9"  (1.753 m)   Wt 160 lb (72.6 kg)   BMI 23.63 kg/m   Constitutional:  Alert and oriented, No acute distress. HEENT: [place] AT, moist mucus membranes.  Trachea midline, no masses. Cardiovascular: No clubbing, cyanosis, or edema. Respiratory: Normal respiratory effort, no increased work of breathing. GI: Abdomen is soft, nontender, nondistended, no abdominal masses GU: Phallus without lesions, testes descended bilaterally without masses or tenderness.  No perineal tenderness Skin: No rashes, bruises or suspicious lesions. Neurologic: Grossly intact, no focal deficits, moving all 4 extremities. Psychiatric: Normal mood and affect.   Assessment & Plan:    1.  Scrotal pain Normal exam and scrotal ultrasound When symptomatic he had no tenderness but had pain sensation and this may represent referred pain potentially prostatic  in etiology Pain has significantly improved however if it recurs instructed to call and we will give a trial of tamsulosin   Riki Altes, MD  [facility] [address] [place], [place] [address] [phone]

## 2020-10-17 DIAGNOSIS — L732 Hidradenitis suppurativa: Principal | ICD-10-CM

## 2020-10-23 ENCOUNTER — Ambulatory Visit: Admit: 2020-10-23 | Discharge: 2020-10-24 | Payer: BLUE CROSS/BLUE SHIELD

## 2020-11-25 ENCOUNTER — Ambulatory Visit: Payer: Medicaid Other | Admitting: Physician Assistant

## 2020-11-25 DIAGNOSIS — Z0289 Encounter for other administrative examinations: Secondary | ICD-10-CM

## 2020-11-28 ENCOUNTER — Ambulatory Visit: Payer: Medicaid Other | Admitting: Physician Assistant

## 2020-12-18 ENCOUNTER — Ambulatory Visit: Admit: 2020-12-18 | Payer: BLUE CROSS/BLUE SHIELD

## 2021-01-03 ENCOUNTER — Ambulatory Visit: Payer: Medicaid Other | Admitting: Physician Assistant

## 2021-01-17 ENCOUNTER — Telehealth: Payer: Self-pay

## 2021-01-17 NOTE — Telephone Encounter (Signed)
Patient's mother requested to schedule appointment for patient. I explained to her due to the number of "no shows", our manager will need to discuss with provider. We will call her back to let her know if patient can be scheduled again or not-Toni

## 2021-01-20 NOTE — Telephone Encounter (Signed)
Lvm for mother to let her know due to the number of no shows and cancelled appointment, we are unable to schedule appointment for patient and that he will need to find another pcp-Toni

## 2021-02-06 DIAGNOSIS — L732 Hidradenitis suppurativa: Principal | ICD-10-CM

## 2021-11-27 ENCOUNTER — Ambulatory Visit
Admit: 2021-11-27 | Discharge: 2021-11-28 | Payer: BLUE CROSS/BLUE SHIELD | Attending: Student in an Organized Health Care Education/Training Program | Primary: Student in an Organized Health Care Education/Training Program

## 2021-11-27 DIAGNOSIS — L732 Hidradenitis suppurativa: Principal | ICD-10-CM

## 2021-11-27 MED ORDER — DOXYCYCLINE HYCLATE 100 MG TABLET
ORAL_TABLET | 3 refills | 0.00000 days | Status: CP
Start: 2021-11-27 — End: ?

## 2022-04-13 ENCOUNTER — Ambulatory Visit: Admit: 2022-04-13 | Discharge: 2022-04-14 | Payer: BLUE CROSS/BLUE SHIELD

## 2022-04-13 DIAGNOSIS — Z79899 Other long term (current) drug therapy: Principal | ICD-10-CM

## 2022-04-13 DIAGNOSIS — L732 Hidradenitis suppurativa: Principal | ICD-10-CM

## 2022-04-13 MED ORDER — ROFLUMILAST 500 MCG TABLET
ORAL_TABLET | Freq: Every day | ORAL | 90 refills | 1.00000 days | Status: CN
Start: 2022-04-13 — End: ?

## 2022-04-13 MED ORDER — SECUKINUMAB 300 MG/2 ML (150 MG/ML) SUBCUTANEOUS PEN INJECTOR
SUBCUTANEOUS | 11 refills | 0.00000 days | Status: CN
Start: 2022-04-13 — End: ?

## 2022-04-20 ENCOUNTER — Other Ambulatory Visit: Payer: Self-pay

## 2022-04-20 MED ORDER — AMPHETAMINE-DEXTROAMPHETAMINE 20 MG PO TABS: 20.0000 mg | ORAL_TABLET | Freq: Two times a day (BID) | ORAL | 0 refills | Status: DC

## 2022-04-30 ENCOUNTER — Ambulatory Visit: Payer: PRIVATE HEALTH INSURANCE | Admitting: Family

## 2022-06-17 ENCOUNTER — Encounter: Payer: Self-pay | Admitting: Family

## 2022-06-17 ENCOUNTER — Ambulatory Visit (INDEPENDENT_AMBULATORY_CARE_PROVIDER_SITE_OTHER): Payer: PRIVATE HEALTH INSURANCE | Admitting: Family

## 2022-06-17 VITALS — BP 110/80 | HR 77 | Ht 70.0 in | Wt 182.2 lb

## 2022-06-17 DIAGNOSIS — R7303 Prediabetes: Secondary | ICD-10-CM | POA: Diagnosis not present

## 2022-06-17 DIAGNOSIS — E782 Mixed hyperlipidemia: Secondary | ICD-10-CM

## 2022-06-17 DIAGNOSIS — I10 Essential (primary) hypertension: Secondary | ICD-10-CM

## 2022-06-17 DIAGNOSIS — R5383 Other fatigue: Secondary | ICD-10-CM

## 2022-06-17 DIAGNOSIS — E538 Deficiency of other specified B group vitamins: Secondary | ICD-10-CM

## 2022-06-17 DIAGNOSIS — E559 Vitamin D deficiency, unspecified: Secondary | ICD-10-CM | POA: Diagnosis not present

## 2022-06-17 MED ORDER — FEXOFENADINE HCL 180 MG PO TABS: 180.0000 mg | ORAL_TABLET | Freq: Every day | ORAL | 1 refills | Status: AC

## 2022-06-17 NOTE — Progress Notes (Signed)
Established Patient Office Visit  Subjective:  Patient ID: Luis Simon, male    DOB: 1996-01-16  Age: 27 y.o. MRN: [id number]  Chief Complaint  Patient presents with   Follow-up    4 month follow up    Patient is here today for his 4 months follow up.  He has been feeling well since last appointment.   He does have additional concerns to discuss today.  Says that he has been having significant allergy issues for the last several weeks, with runny nose, itchy eyes, and occasional cough.   Labs are due today. He needs refills.   I have reviewed his active problem list, medication list, allergies, family history, notes from last encounter, lab results for his appointment today.      No other concerns at this time.   Past Medical History:  Diagnosis Date   ADHD     Past Surgical History:  Procedure Laterality Date   none     SKIN SURGERY Right    right arm anfd left groin    Social History   Socioeconomic History   Marital status: Single    Spouse name: Not on file   Number of children: Not on file   Years of education: Not on file   Highest education level: Not on file  Occupational History   Not on file  Tobacco Use   Smoking status: Never   Smokeless tobacco: Never  Vaping Use   Vaping Use: Never used  Substance and Sexual Activity   Alcohol use: No   Drug use: No   Sexual activity: Yes    Birth control/protection: Condom  Other Topics Concern   Not on file  Social History Narrative   Not on file   Social Determinants of Health   Financial Resource Strain: Not on file  Food Insecurity: Not on file  Transportation Needs: Not on file  Physical Activity: Not on file  Stress: Not on file  Social Connections: Not on file  Intimate Partner Violence: Not on file    Family History  Family history unknown: Yes    No Known Allergies  Review of Systems  HENT:  Positive for congestion and sinus pain.   All other systems reviewed and are  negative.      Objective:   BP 110/80   Pulse 77   Ht  (1.778 m)   Wt 182 lb 3.2 oz (82.6 kg)   SpO2 97%   BMI 26.14 kg/m   Vitals:   06/17/22 0949  BP: 110/80  Pulse: 77  Height:  (1.778 m)  Weight: 182 lb 3.2 oz (82.6 kg)  SpO2: 97%  BMI (Calculated): 26.14    Physical Exam Vitals and nursing note reviewed.  Constitutional:      Appearance: Normal appearance. He is normal weight.  Eyes:     Pupils: Pupils are equal, round, and reactive to light.  Cardiovascular:     Rate and Rhythm: Normal rate and regular rhythm.     Pulses: Normal pulses.     Heart sounds: Normal heart sounds.  Pulmonary:     Effort: Pulmonary effort is normal.     Breath sounds: Normal breath sounds.  Neurological:     Mental Status: He is alert.  Psychiatric:        Mood and Affect: Mood normal.        Behavior: Behavior normal.      No results found for any visits on 06/17/22.  No results found for this or any previous visit (from the past 2160 hour(s)).     Assessment & Plan:   Problem List Items Addressed This Visit   None Visit Diagnoses     Vitamin D deficiency, unspecified    -  Primary   Relevant Orders   VITAMIN D 25 Hydroxy (Vit-D Deficiency, Fractures)   CBC With Differential   CMP14+EGFR   Prediabetes       Relevant Orders   CBC With Differential   CMP14+EGFR   Hemoglobin A1c   Mixed hyperlipidemia       Relevant Orders   Lipid panel   CBC With Differential   CMP14+EGFR   Essential hypertension, benign       Relevant Orders   CBC With Differential   CMP14+EGFR   B12 deficiency due to diet       Relevant Orders   CBC With Differential   CMP14+EGFR   Vitamin B12   Other fatigue       Relevant Orders   CBC With Differential   CMP14+EGFR   TSH       Return in about 6 months (around 12/17/2022) for F/U.   Total time spent: 20 minutes  Miki Kins, FNP  06/17/2022

## 2022-06-19 LAB — CMP14+EGFR
ALT: 26 IU/L (ref 0–44)
AST: 24 IU/L (ref 0–40)
Albumin/Globulin Ratio: 1.5 (ref 1.2–2.2)
Albumin: 4.5 g/dL (ref 4.3–5.2)
Alkaline Phosphatase: 59 IU/L (ref 44–121)
BUN/Creatinine Ratio: 10 (ref 9–20)
BUN: 12 mg/dL (ref 6–20)
Bilirubin Total: 0.7 mg/dL (ref 0.0–1.2)
CO2: 20 mmol/L (ref 20–29)
Calcium: 9.8 mg/dL (ref 8.7–10.2)
Chloride: 103 mmol/L (ref 96–106)
Creatinine, Ser: 1.25 mg/dL (ref 0.76–1.27)
Globulin, Total: 3.1 g/dL (ref 1.5–4.5)
Glucose: 97 mg/dL (ref 70–99)
Potassium: 4.6 mmol/L (ref 3.5–5.2)
Sodium: 143 mmol/L (ref 134–144)
Total Protein: 7.6 g/dL (ref 6.0–8.5)
eGFR: 82 mL/min/{1.73_m2} (ref >59)

## 2022-06-19 LAB — VITAMIN B12: Vitamin B-12: 501 pg/mL (ref 232–1245)

## 2022-06-19 LAB — CBC WITH DIFFERENTIAL
Basophils Absolute: 0.1 10*3/uL (ref 0.0–0.2)
Basos: 1 %
EOS (ABSOLUTE): 0.2 10*3/uL (ref 0.0–0.4)
Eos: 3 %
Hematocrit: 42.3 % (ref 37.5–51.0)
Hemoglobin: 14.6 g/dL (ref 13.0–17.7)
Immature Grans (Abs): 0 10*3/uL (ref 0.0–0.1)
Immature Granulocytes: 0 %
Lymphocytes Absolute: 1.8 10*3/uL (ref 0.7–3.1)
Lymphs: 27 %
MCH: 31.9 pg (ref 26.6–33.0)
MCHC: 34.5 g/dL (ref 31.5–35.7)
MCV: 93 fL (ref 79–97)
Monocytes Absolute: 0.7 10*3/uL (ref 0.1–0.9)
Monocytes: 10 %
Neutrophils Absolute: 4 10*3/uL (ref 1.4–7.0)
Neutrophils: 59 %
RBC: 4.57 x10E6/uL (ref 4.14–5.80)
RDW: 12 % (ref 11.6–15.4)
WBC: 6.8 10*3/uL (ref 3.4–10.8)

## 2022-06-19 LAB — LIPID PANEL
Chol/HDL Ratio: 4.5 ratio (ref 0.0–5.0)
Cholesterol, Total: 178 mg/dL (ref 100–199)
HDL: 40 mg/dL (ref >39)
LDL Chol Calc (NIH): 125 mg/dL — ABNORMAL HIGH (ref 0–99)
Triglycerides: 71 mg/dL (ref 0–149)
VLDL Cholesterol Cal: 13 mg/dL (ref 5–40)

## 2022-06-19 LAB — VITAMIN D 25 HYDROXY (VIT D DEFICIENCY, FRACTURES): Vit D, 25-Hydroxy: 11.5 ng/mL — ABNORMAL LOW (ref 30.0–100.0)

## 2022-06-19 LAB — HEMOGLOBIN A1C
Est. average glucose Bld gHb Est-mCnc: 114 mg/dL
Hgb A1c MFr Bld: 5.6 % (ref 4.8–5.6)

## 2022-06-19 LAB — TSH: TSH: 2.25 u[IU]/mL (ref 0.450–4.500)

## 2022-06-22 ENCOUNTER — Other Ambulatory Visit: Payer: Self-pay

## 2022-06-24 ENCOUNTER — Telehealth: Payer: Self-pay | Admitting: Family

## 2022-06-24 MED ORDER — AMPHETAMINE-DEXTROAMPHETAMINE 20 MG PO TABS: 20.0000 mg | ORAL_TABLET | Freq: Two times a day (BID) | ORAL | 0 refills | Status: DC

## 2022-06-24 NOTE — Telephone Encounter (Signed)
Patient called needing new Rx for Adderall. Please send to [facility], [place].

## 2022-07-09 MED ORDER — VITAMIN D (ERGOCALCIFEROL) 1.25 MG (50000 UNIT) PO CAPS: 50000.0000 [IU] | ORAL_CAPSULE | ORAL | 1 refills | Status: DC

## 2022-07-09 NOTE — Addendum Note (Signed)
Addended by: Grayling Congress on: 07/09/2022 12:41 PM   Modules accepted: Orders

## 2022-07-28 ENCOUNTER — Other Ambulatory Visit: Payer: Self-pay | Admitting: Family

## 2022-07-30 MED ORDER — AMPHETAMINE-DEXTROAMPHETAMINE 20 MG PO TABS: 20.0000 mg | ORAL_TABLET | Freq: Two times a day (BID) | ORAL | 0 refills | Status: DC

## 2022-07-30 NOTE — Telephone Encounter (Signed)
Patient called needing his adderall sent to the pharmacy.

## 2022-08-03 MED ORDER — AMPHETAMINE-DEXTROAMPHETAMINE 20 MG PO TABS: 20.0000 mg | ORAL_TABLET | Freq: Two times a day (BID) | ORAL | 0 refills | Status: DC

## 2022-08-03 NOTE — Addendum Note (Signed)
Addended by: Grayling Congress on: 08/03/2022 08:01 PM   Modules accepted: Orders

## 2022-09-11 ENCOUNTER — Other Ambulatory Visit: Payer: Self-pay

## 2022-09-11 ENCOUNTER — Telehealth: Payer: Self-pay | Admitting: Family

## 2022-09-11 NOTE — Telephone Encounter (Signed)
Patient's mother called needing patient's Adderall sent in. Please advise.  [facility]

## 2022-09-12 MED ORDER — AMPHETAMINE-DEXTROAMPHETAMINE 20 MG PO TABS: 20.0000 mg | ORAL_TABLET | Freq: Two times a day (BID) | ORAL | 0 refills | Status: DC

## 2022-09-28 ENCOUNTER — Ambulatory Visit: Admit: 2022-09-28 | Discharge: 2022-09-29 | Payer: PRIVATE HEALTH INSURANCE

## 2022-09-28 DIAGNOSIS — L732 Hidradenitis suppurativa: Principal | ICD-10-CM

## 2022-09-28 MED ORDER — HYDROCODONE 5 MG-ACETAMINOPHEN 325 MG TABLET
ORAL_TABLET | ORAL | 0 refills | 0.00000 days | Status: CP
Start: 2022-09-28 — End: 2022-09-28

## 2022-09-28 MED ORDER — COSENTYX UNOREADY PEN 300 MG/2 ML (150 MG/ML) SUBCUTANEOUS
SUBCUTANEOUS | 11 refills | 0.00000 days | Status: CP
Start: 2022-09-28 — End: ?

## 2022-09-29 DIAGNOSIS — L732 Hidradenitis suppurativa: Principal | ICD-10-CM

## 2022-09-29 MED ORDER — HYDROCODONE 5 MG-ACETAMINOPHEN 325 MG TABLET
ORAL_TABLET | 0 refills | 0.00000 days
Start: 2022-09-29 — End: ?

## 2022-10-02 MED ORDER — HYDROCODONE 5 MG-ACETAMINOPHEN 325 MG TABLET
ORAL_TABLET | 0 refills | 0.00000 days
Start: 2022-10-02 — End: ?

## 2022-10-13 DIAGNOSIS — L732 Hidradenitis suppurativa: Principal | ICD-10-CM

## 2022-10-13 MED ORDER — COSENTYX UNOREADY PEN 300 MG/2 ML (150 MG/ML) SUBCUTANEOUS
SUBCUTANEOUS | 11 refills | 0.00000 days | Status: CP
Start: 2022-10-13 — End: ?

## 2022-10-16 ENCOUNTER — Other Ambulatory Visit: Payer: Self-pay | Admitting: Family

## 2022-10-19 MED ORDER — AMPHETAMINE-DEXTROAMPHETAMINE 20 MG PO TABS: 20.0000 mg | ORAL_TABLET | Freq: Two times a day (BID) | ORAL | 0 refills | Status: DC

## 2022-11-12 ENCOUNTER — Ambulatory Visit: Payer: PRIVATE HEALTH INSURANCE | Admitting: Family

## 2022-12-07 ENCOUNTER — Encounter: Payer: Self-pay | Admitting: Family

## 2022-12-07 ENCOUNTER — Ambulatory Visit (INDEPENDENT_AMBULATORY_CARE_PROVIDER_SITE_OTHER): Payer: PRIVATE HEALTH INSURANCE | Admitting: Family

## 2022-12-07 VITALS — BP 132/78 | HR 73 | Ht 69.0 in | Wt 185.4 lb

## 2022-12-07 DIAGNOSIS — I10 Essential (primary) hypertension: Secondary | ICD-10-CM

## 2022-12-07 DIAGNOSIS — E559 Vitamin D deficiency, unspecified: Secondary | ICD-10-CM

## 2022-12-07 DIAGNOSIS — E782 Mixed hyperlipidemia: Secondary | ICD-10-CM

## 2022-12-07 DIAGNOSIS — L732 Hidradenitis suppurativa: Secondary | ICD-10-CM | POA: Diagnosis not present

## 2022-12-07 DIAGNOSIS — E538 Deficiency of other specified B group vitamins: Secondary | ICD-10-CM | POA: Diagnosis not present

## 2022-12-07 DIAGNOSIS — R7303 Prediabetes: Secondary | ICD-10-CM

## 2022-12-07 DIAGNOSIS — F900 Attention-deficit hyperactivity disorder, predominantly inattentive type: Secondary | ICD-10-CM | POA: Insufficient documentation

## 2022-12-07 MED ORDER — AMPHETAMINE-DEXTROAMPHETAMINE 20 MG PO TABS: 20.0000 mg | ORAL_TABLET | Freq: Two times a day (BID) | ORAL | 0 refills | Status: DC

## 2022-12-07 NOTE — Progress Notes (Signed)
Established Patient Office Visit  Subjective:  Patient ID: Luis Simon, male    DOB: 08-13-1995  Age: 27 y.o. MRN: [id number]  Chief Complaint  Patient presents with   Follow-up    Patient is here today for his 3 months follow up.  He has been feeling fairly well since last appointment.   He does not have additional concerns to discuss today.  His dermatologist has been working to get him set up with a local place for him to go for his injections of Cosentyx, which they started him on for his HS.   Labs are due today. He needs refills.   I have reviewed his active problem list, medication list, allergies, notes from last encounter, lab results for his appointment today.      No other concerns at this time.   Past Medical History:  Diagnosis Date   ADHD     Past Surgical History:  Procedure Laterality Date   none     SKIN SURGERY Right    right arm anfd left groin    Social History   Socioeconomic History   Marital status: Single    Spouse name: Not on file   Number of children: Not on file   Years of education: Not on file   Highest education level: Not on file  Occupational History   Not on file  Tobacco Use   Smoking status: Never   Smokeless tobacco: Never  Vaping Use   Vaping status: Never Used  Substance and Sexual Activity   Alcohol use: No   Drug use: No   Sexual activity: Yes    Birth control/protection: Condom  Other Topics Concern   Not on file  Social History Narrative   Not on file   Social Determinants of Health   Financial Resource Strain: Not on file  Food Insecurity: Not on file  Transportation Needs: Not on file  Physical Activity: Not on file  Stress: Not on file  Social Connections: Not on file  Intimate Partner Violence: Not on file    Family History  Family history unknown: Yes    No Known Allergies  Review of Systems  All other systems reviewed and are negative.      Objective:   BP 132/78   Pulse  73   Ht 5\' 9"  (1.753 m)   Wt 185 lb 6.4 oz (84.1 kg)   SpO2 98%   BMI 27.38 kg/m   Vitals:   12/07/22 0903  BP: 132/78  Pulse: 73  Height: 5\' 9"  (1.753 m)  Weight: 185 lb 6.4 oz (84.1 kg)  SpO2: 98%  BMI (Calculated): 27.37    Physical Exam Vitals and nursing note reviewed.  Constitutional:      Appearance: Normal appearance. He is normal weight.  Eyes:     Extraocular Movements: Extraocular movements intact.     Conjunctiva/sclera: Conjunctivae normal.     Pupils: Pupils are equal, round, and reactive to light.  Cardiovascular:     Rate and Rhythm: Normal rate and regular rhythm.     Pulses: Normal pulses.     Heart sounds: Normal heart sounds.  Pulmonary:     Effort: Pulmonary effort is normal.     Breath sounds: Normal breath sounds.  Neurological:     General: No focal deficit present.     Mental Status: He is alert and oriented to person, place, and time. Mental status is at baseline.  Psychiatric:  Mood and Affect: Mood normal.        Behavior: Behavior normal.        Thought Content: Thought content normal.        Judgment: Judgment normal.      No results found for any visits on 12/07/22.  No results found for this or any previous visit (from the past 2160 hour(s)).     Assessment & Plan:   Problem List Items Addressed This Visit       Active Problems   Hidradenitis - Primary    Patient is seen by Dermatology, who manage this condition.  He is well controlled with current therapy.   Will defer to them for further changes to plan of care.       Relevant Orders   CMP14+EGFR   CBC with Differential/Platelet   Attention deficit hyperactivity disorder (ADHD), predominantly inattentive type   Other Visit Diagnoses     B12 deficiency due to diet       Checking labs today.  Will continue supplements as needed.   Relevant Orders   CMP14+EGFR   Vitamin B12   CBC with Differential/Platelet   Mixed hyperlipidemia       Checking labs  today.  Continue current therapy for lipid control. Will modify as needed based on labwork results.   Relevant Orders   Lipid panel   CMP14+EGFR   CBC with Differential/Platelet   Prediabetes       A1C is in prediabetic ranges. Patient counseled on dietary choices and verbalized understanding. Will reassess at follow up after next lab check.   Relevant Orders   CMP14+EGFR   Hemoglobin A1c   CBC with Differential/Platelet   Vitamin D deficiency, unspecified       Checking labs today.  Will continue supplements as needed.   Relevant Orders   VITAMIN D 25 Hydroxy (Vit-D Deficiency, Fractures)   CMP14+EGFR   CBC with Differential/Platelet   Essential hypertension, benign       Checking labs today.  Will continue supplements as needed.   Relevant Orders   CMP14+EGFR   CBC with Differential/Platelet       Return 1-2 months, for CPE.   Total time spent: 20 minutes  Miki Kins, FNP  12/07/2022   This document may have been prepared by [facility] Voice Recognition software and as such may include unintentional dictation errors.

## 2022-12-07 NOTE — Assessment & Plan Note (Signed)
Patient is seen by Dermatology, who manage this condition.  He is well controlled with current therapy.   Will defer to them for further changes to plan of care.

## 2022-12-08 LAB — CMP14+EGFR
ALT: 35 [IU]/L (ref 0–44)
AST: 24 [IU]/L (ref 0–40)
Albumin: 4.6 g/dL (ref 4.3–5.2)
Alkaline Phosphatase: 55 [IU]/L (ref 44–121)
BUN/Creatinine Ratio: 8 — ABNORMAL LOW (ref 9–20)
BUN: 10 mg/dL (ref 6–20)
Bilirubin Total: 1.1 mg/dL (ref 0.0–1.2)
CO2: 25 mmol/L (ref 20–29)
Calcium: 9.9 mg/dL (ref 8.7–10.2)
Chloride: 99 mmol/L (ref 96–106)
Creatinine, Ser: 1.18 mg/dL (ref 0.76–1.27)
Globulin, Total: 3 g/dL (ref 1.5–4.5)
Glucose: 86 mg/dL (ref 70–99)
Potassium: 4.7 mmol/L (ref 3.5–5.2)
Sodium: 139 mmol/L (ref 134–144)
Total Protein: 7.6 g/dL (ref 6.0–8.5)
eGFR: 88 mL/min/{1.73_m2} (ref >59)

## 2022-12-08 LAB — VITAMIN D 25 HYDROXY (VIT D DEFICIENCY, FRACTURES): Vit D, 25-Hydroxy: 17.4 ng/mL — ABNORMAL LOW (ref 30.0–100.0)

## 2022-12-08 LAB — LIPID PANEL
Chol/HDL Ratio: 5 {ratio} (ref 0.0–5.0)
Cholesterol, Total: 234 mg/dL — ABNORMAL HIGH (ref 100–199)
HDL: 47 mg/dL (ref >39)
LDL Chol Calc (NIH): 167 mg/dL — ABNORMAL HIGH (ref 0–99)
Triglycerides: 109 mg/dL (ref 0–149)
VLDL Cholesterol Cal: 20 mg/dL (ref 5–40)

## 2022-12-08 LAB — VITAMIN B12: Vitamin B-12: 594 pg/mL (ref 232–1245)

## 2022-12-08 LAB — HEMOGLOBIN A1C
Est. average glucose Bld gHb Est-mCnc: 114 mg/dL
Hgb A1c MFr Bld: 5.6 % (ref 4.8–5.6)

## 2022-12-15 ENCOUNTER — Telehealth: Payer: Self-pay | Admitting: Family

## 2022-12-15 NOTE — Telephone Encounter (Signed)
Patient's mother left VM that patient is having ringing in his ears and they would like to schedule an appt for Friday. Please call to schedule.

## 2022-12-23 IMAGING — US US SCROTUM W/ DOPPLER COMPLETE
1 series · 14 of 25 positions shown · non-contrast
Comparison: None.

CLINICAL DATA: Scrotal swelling 2 days with pain

EXAM:
SCROTAL ULTRASOUND
DOPPLER ULTRASOUND OF THE TESTICLES
TECHNIQUE: Complete ultrasound examination of the testicles, epididymis, and
other scrotal structures was performed. Color and spectral Doppler
ultrasound were also utilized to evaluate blood flow to the
testicles.

[Series 1: us scrotum w/doppler · 14 of 53 slices shown]
[im 1/53]
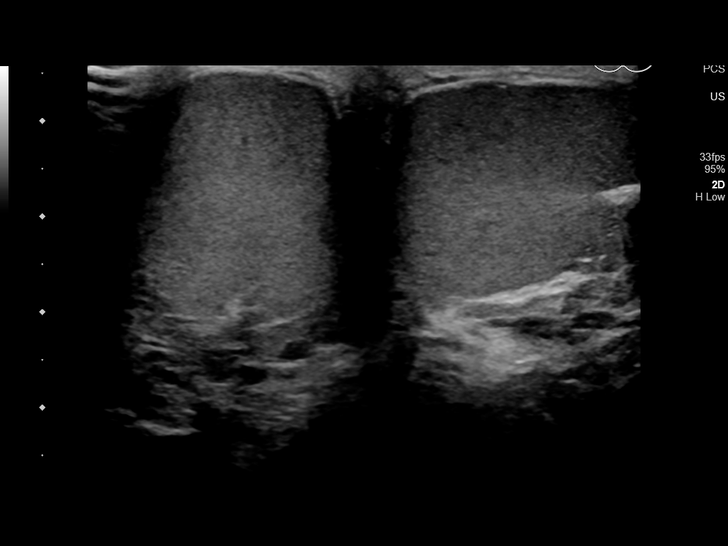
[im 5/53]
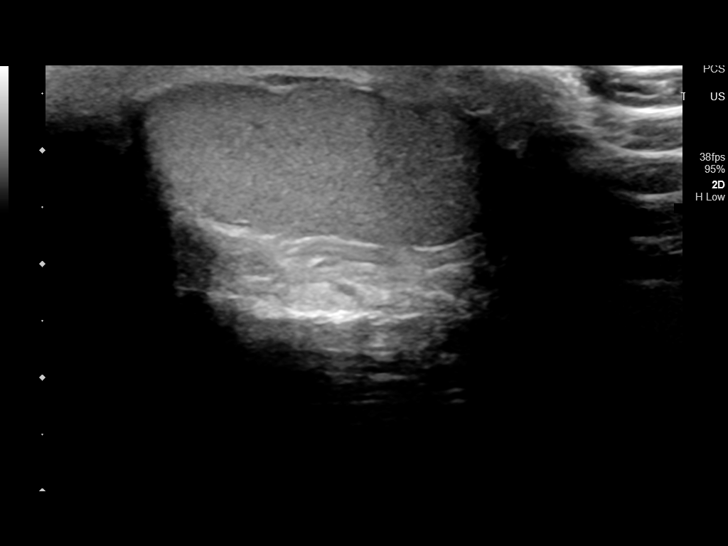
[im 9/53]
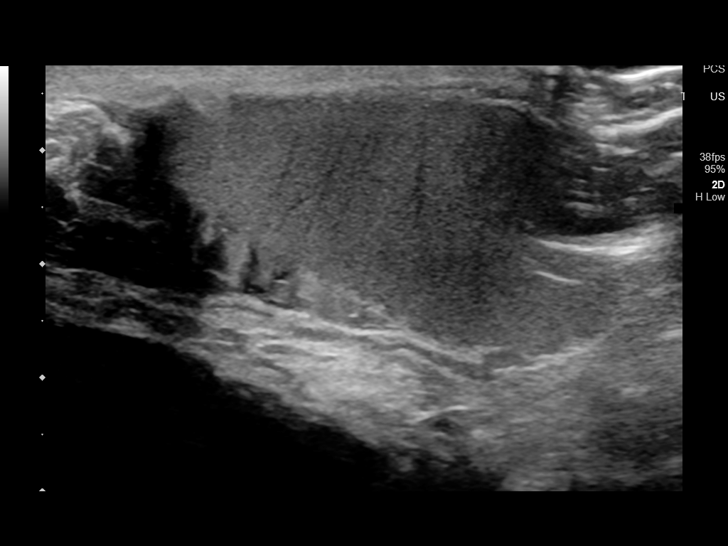
[im 14/53]
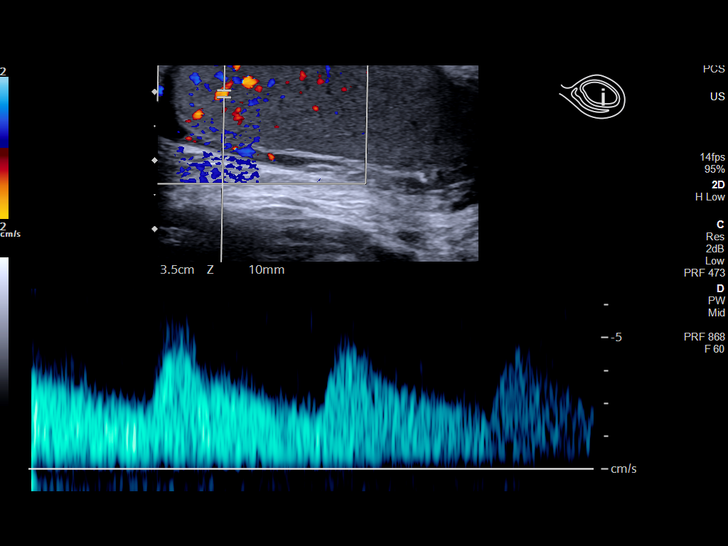
[im 18/53]
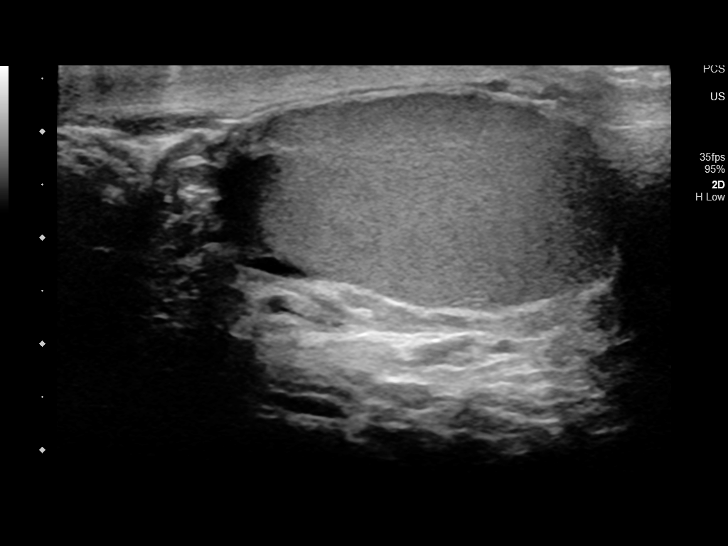
[im 20/53]
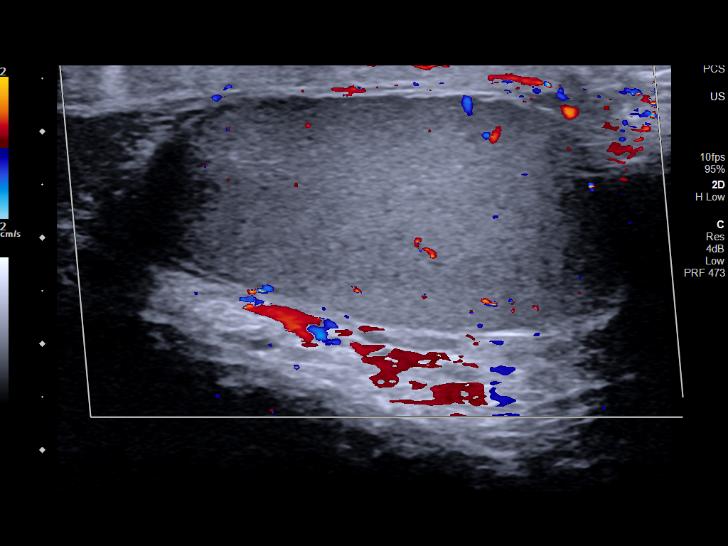
[im 24/53]
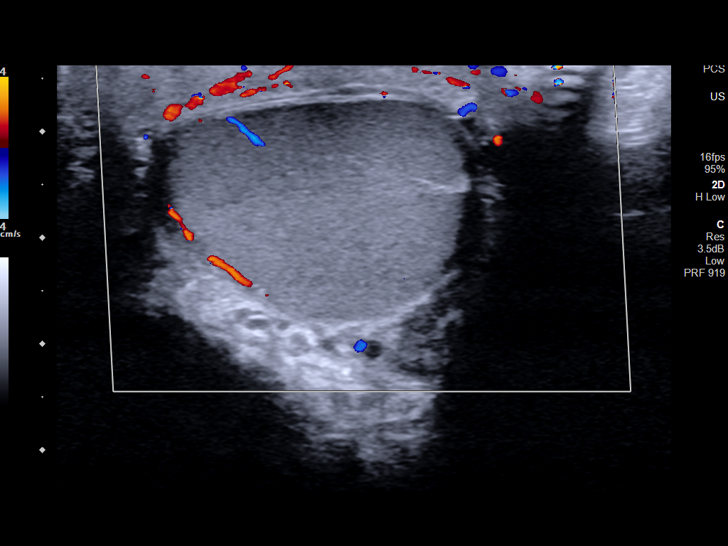
[im 29/53]
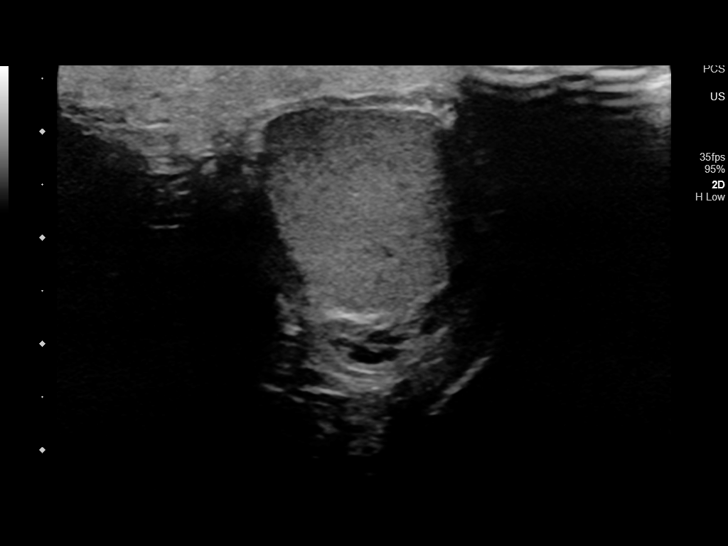
[im 33/53]
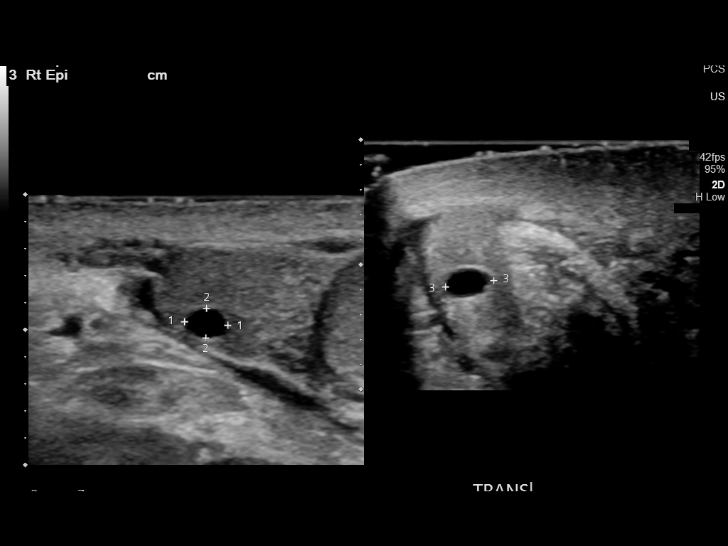
[im 35/53]
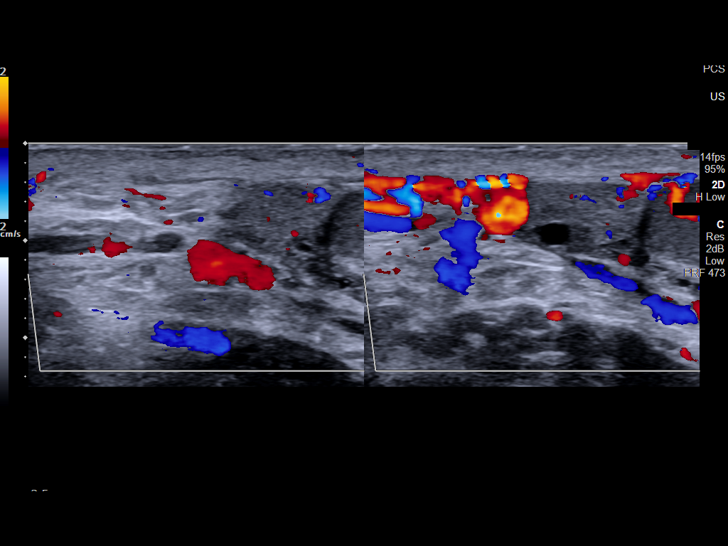
[im 40/53]
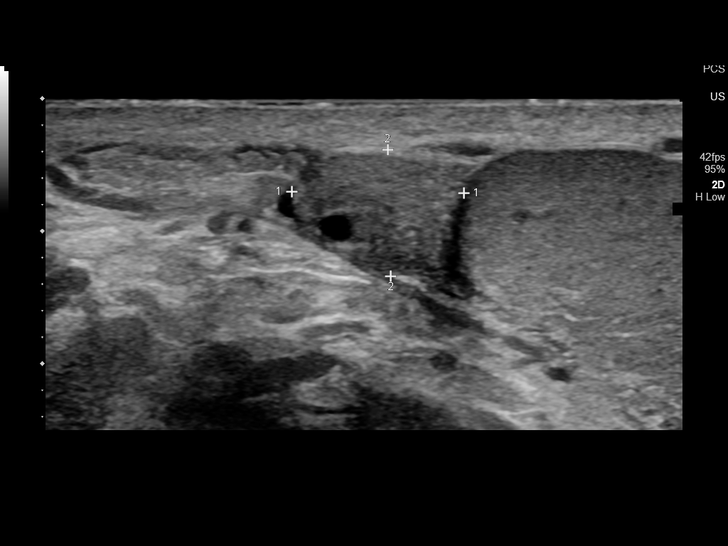
[im 44/53]
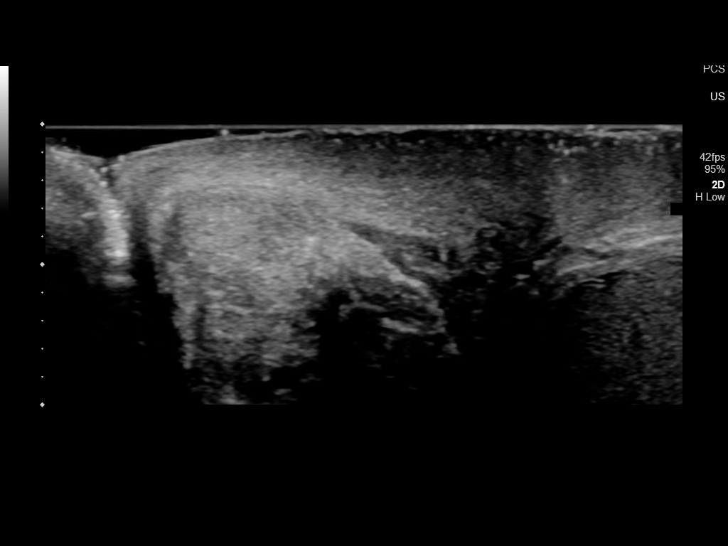
[im 48/53]
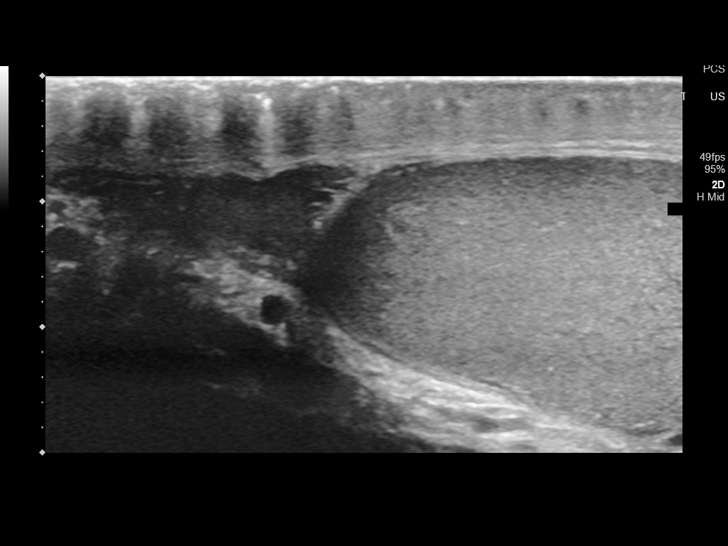
[im 53/53]
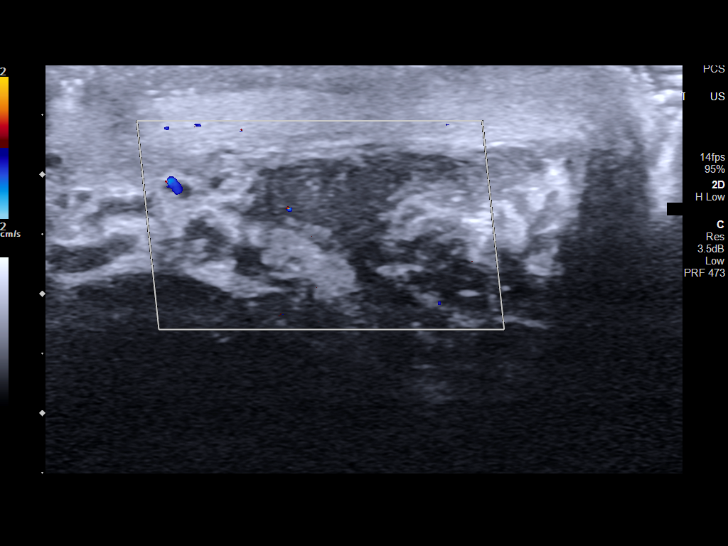

[14 of 25 positions shown; findings below may reference images not displayed]

FINDINGS: Right testicle

Measurements: 4.7 x 2.1 x 2.7 cm. No mass or microlithiasis
visualized.

Left testicle

Measurements: 5.0 x 2.2 x 2.8 cm. No mass or microlithiasis
visualized.

Right epididymis:  2 x 3 mm cyst.

Left epididymis:  Normal in size and appearance.

Hydrocele:  None visualized.

Varicocele:  None visualized.

Pulsed Doppler interrogation of both testes demonstrates normal low
resistance arterial and venous waveforms bilaterally.
IMPRESSION: Negative scrotal ultrasound.

## 2023-01-23 ENCOUNTER — Other Ambulatory Visit: Payer: Self-pay | Admitting: Family

## 2023-01-25 MED ORDER — AMPHETAMINE-DEXTROAMPHETAMINE 20 MG PO TABS: 20.0000 mg | ORAL_TABLET | Freq: Two times a day (BID) | ORAL | 0 refills | Status: DC

## 2023-02-05 ENCOUNTER — Ambulatory Visit (INDEPENDENT_AMBULATORY_CARE_PROVIDER_SITE_OTHER): Payer: PRIVATE HEALTH INSURANCE | Admitting: Family

## 2023-02-05 ENCOUNTER — Encounter: Payer: Self-pay | Admitting: Family

## 2023-02-05 VITALS — BP 102/70 | HR 59 | Ht 69.0 in | Wt 184.6 lb

## 2023-02-05 DIAGNOSIS — Z0000 Encounter for general adult medical examination without abnormal findings: Secondary | ICD-10-CM | POA: Diagnosis not present

## 2023-02-05 DIAGNOSIS — Z202 Contact with and (suspected) exposure to infections with a predominantly sexual mode of transmission: Secondary | ICD-10-CM

## 2023-02-05 DIAGNOSIS — I10 Essential (primary) hypertension: Secondary | ICD-10-CM

## 2023-02-05 DIAGNOSIS — F900 Attention-deficit hyperactivity disorder, predominantly inattentive type: Secondary | ICD-10-CM

## 2023-02-05 DIAGNOSIS — E782 Mixed hyperlipidemia: Secondary | ICD-10-CM

## 2023-02-05 DIAGNOSIS — L732 Hidradenitis suppurativa: Secondary | ICD-10-CM

## 2023-02-05 DIAGNOSIS — Z025 Encounter for examination for participation in sport: Secondary | ICD-10-CM

## 2023-02-05 DIAGNOSIS — R5383 Other fatigue: Secondary | ICD-10-CM

## 2023-02-05 DIAGNOSIS — R7303 Prediabetes: Secondary | ICD-10-CM

## 2023-02-05 DIAGNOSIS — E538 Deficiency of other specified B group vitamins: Secondary | ICD-10-CM | POA: Diagnosis not present

## 2023-02-05 DIAGNOSIS — E559 Vitamin D deficiency, unspecified: Secondary | ICD-10-CM

## 2023-02-05 LAB — POCT URINALYSIS DIPSTICK
Bilirubin, UA: NEGATIVE
Blood, UA: NEGATIVE
Glucose, UA: NEGATIVE
Ketones, UA: NEGATIVE
Leukocytes, UA: NEGATIVE
Nitrite, UA: NEGATIVE
Protein, UA: NEGATIVE
Spec Grav, UA: 1.015 (ref 1.010–1.025)
Urobilinogen, UA: 1 U/dL
pH, UA: 7.5 (ref 5.0–8.0)

## 2023-02-07 ENCOUNTER — Encounter: Payer: Self-pay | Admitting: Family

## 2023-02-07 LAB — CHLAMYDIA/GONOCOCCUS/TRICHOMONAS, NAA
Chlamydia by NAA: NEGATIVE
Gonococcus by NAA: NEGATIVE
Trich vag by NAA: NEGATIVE

## 2023-02-07 NOTE — Patient Instructions (Signed)
Health Maintenance, Male Adopting a healthy lifestyle and getting preventive care are important in promoting health and wellness. Ask your health care provider about: The right schedule for you to have regular tests and exams. Things you can do on your own to prevent diseases and keep yourself healthy. What should I know about diet, weight, and exercise? Eat a healthy diet  Eat a diet that includes plenty of vegetables, fruits, low-fat dairy products, and lean protein. Do not eat a lot of foods that are high in solid fats, added sugars, or sodium. Maintain a healthy weight Body mass index (BMI) is a measurement that can be used to identify possible weight problems. It estimates body fat based on height and weight. Your health care provider can help determine your BMI and help you achieve or maintain a healthy weight. Get regular exercise Get regular exercise. This is one of the most important things you can do for your health. Most adults should: Exercise for at least 150 minutes each week. The exercise should increase your heart rate and make you sweat (moderate-intensity exercise). Do strengthening exercises at least twice a week. This is in addition to the moderate-intensity exercise. Spend less time sitting. Even light physical activity can be beneficial. Watch cholesterol and blood lipids Have your blood tested for lipids and cholesterol at 25 years of age, then have this test every 5 years. You may need to have your cholesterol levels checked more often if: Your lipid or cholesterol levels are high. You are older than 25 years of age. You are at high risk for heart disease. What should I know about cancer screening? Many types of cancers can be detected early and may often be prevented. Depending on your health history and family history, you may need to have cancer screening at various ages. This may include screening for: Colorectal cancer. Prostate cancer. Skin cancer. Lung  cancer. What should I know about heart disease, diabetes, and high blood pressure? Blood pressure and heart disease High blood pressure causes heart disease and increases the risk of stroke. This is more likely to develop in people who have high blood pressure readings or are overweight. Talk with your health care provider about your target blood pressure readings. Have your blood pressure checked: Every 3-5 years if you are 18-39 years of age. Every year if you are 40 years old or older. If you are between the ages of 65 and 75 and are a current or former smoker, ask your health care provider if you should have a one-time screening for abdominal aortic aneurysm (AAA). Diabetes Have regular diabetes screenings. This checks your fasting blood sugar level. Have the screening done: Once every three years after age 45 if you are at a normal weight and have a low risk for diabetes. More often and at a younger age if you are overweight or have a high risk for diabetes. What should I know about preventing infection? Hepatitis B If you have a higher risk for hepatitis B, you should be screened for this virus. Talk with your health care provider to find out if you are at risk for hepatitis B infection. Hepatitis C Blood testing is recommended for: Everyone born from 1945 through 1965. Anyone with known risk factors for hepatitis C. Sexually transmitted infections (STIs) You should be screened each year for STIs, including gonorrhea and chlamydia, if: You are sexually active and are younger than 24 years of age. You are older than 24 years of age and your   health care provider tells you that you are at risk for this type of infection. Your sexual activity has changed since you were last screened, and you are at increased risk for chlamydia or gonorrhea. Ask your health care provider if you are at risk. Ask your health care provider about whether you are at high risk for HIV. Your health care provider  may recommend a prescription medicine to help prevent HIV infection. If you choose to take medicine to prevent HIV, you should first get tested for HIV. You should then be tested every 3 months for as long as you are taking the medicine. Follow these instructions at home: Alcohol use Do not drink alcohol if your health care provider tells you not to drink. If you drink alcohol: Limit how much you have to 0-2 drinks a day. Know how much alcohol is in your drink. In the [place] one drink equals one 12 oz bottle of beer (355 mL), one 5 oz glass of wine (148 mL), or one 1 oz glass of hard liquor (44 mL). Lifestyle Do not use any products that contain nicotine or tobacco. These products include cigarettes, chewing tobacco, and vaping devices, such as e-cigarettes. If you need help quitting, ask your health care provider. Do not use street drugs. Do not share needles. Ask your health care provider for help if you need support or information about quitting drugs. General instructions Schedule regular health, dental, and eye exams. Stay current with your vaccines. Tell your health care provider if: You often feel depressed. You have ever been abused or do not feel safe at home. Summary Adopting a healthy lifestyle and getting preventive care are important in promoting health and wellness. Follow your health care provider's instructions about healthy diet, exercising, and getting tested or screened for diseases. Follow your health care provider's instructions on monitoring your cholesterol and blood pressure. This information is not intended to replace advice given to you by your health care provider. Make sure you discuss any questions you have with your health care provider. Document Revised: 07/08/2020 Document Reviewed: 07/08/2020 Elsevier Patient Education  2024 [employer] Inc.  

## 2023-02-07 NOTE — Assessment & Plan Note (Signed)
Patient stable.  Well controlled with current therapy.   Continue current meds.  

## 2023-02-07 NOTE — Progress Notes (Signed)
Complete physical exam  Patient: Luis Simon   DOB: 14-Dec-1995   27 y.o. Male  MRN: [id number]  Subjective:    Chief Complaint  Patient presents with   Annual Exam    CPE/labs    Luis Simon is a 27 y.o. male who presents today for a complete physical exam. He reports consuming a general diet. Gym/ health club routine includes basketball, cardio, and mod to heavy weightlifting. He generally feels well. He reports sleeping well. He does not have additional problems to discuss today.   Past Medical History:  Diagnosis Date   Acne 12/27/2012   ADHD    Hidradenitis 07/22/2015    Past Surgical History:  Procedure Laterality Date   none     SKIN SURGERY Right    right arm anfd left groin    Family History  Family history unknown: Yes    Social History   Socioeconomic History   Marital status: Single    Spouse name: Not on file   Number of children: Not on file   Years of education: Not on file   Highest education level: Not on file  Occupational History   Not on file  Tobacco Use   Smoking status: Never   Smokeless tobacco: Never  Vaping Use   Vaping status: Never Used  Substance and Sexual Activity   Alcohol use: No   Drug use: No   Sexual activity: Yes    Birth control/protection: Condom  Other Topics Concern   Not on file  Social History Narrative   Not on file   Social Determinants of Health   Financial Resource Strain: Not on file  Food Insecurity: Not on file  Transportation Needs: Not on file  Physical Activity: Not on file  Stress: Not on file  Social Connections: Not on file  Intimate Partner Violence: Not on file    Outpatient Medications Prior to Visit  Medication Sig   amphetamine-dextroamphetamine (ADDERALL) 20 MG tablet Take 1 tablet (20 mg total) by mouth 2 (two) times daily.   fexofenadine (ALLEGRA) 180 MG tablet Take 1 tablet (180 mg total) by mouth daily.   methotrexate (RHEUMATREX) 2.5 MG tablet Take 15 mg by mouth  once a week.   Secukinumab (COSENTYX UNOREADY) 300 MG/2ML SOAJ Inject 300 mg into the skin.   Vitamin D, Ergocalciferol, (DRISDOL) 1.25 MG (50000 UNIT) CAPS capsule Take 1 capsule (50,000 Units total) by mouth every 7 (seven) days.   triamcinolone acetonide (KENALOG-40) 40 MG/ML injection Inject 40 mg into the articular space. (Patient not taking: Reported on 02/05/2023)   No facility-administered medications prior to visit.    Review of Systems  All other systems reviewed and are negative.       Objective:     BP 102/70   Pulse (!) 59   Ht 5\' 9"  (1.753 m)   Wt 184 lb 9.6 oz (83.7 kg)   SpO2 97%   BMI 27.26 kg/m   Physical Exam Vitals and nursing note reviewed.  Constitutional:      Appearance: Normal appearance. He is normal weight.  HENT:     Head: Normocephalic and atraumatic.     Right Ear: Tympanic membrane, ear canal and external ear normal.     Left Ear: Tympanic membrane, ear canal and external ear normal.     Nose: Nose normal.     Mouth/Throat:     Mouth: Mucous membranes are moist.     Pharynx: Oropharynx is clear.  Eyes:  Conjunctiva/sclera: Conjunctivae normal.     Pupils: Pupils are equal, round, and reactive to light.  Cardiovascular:     Rate and Rhythm: Normal rate and regular rhythm.     Pulses: Normal pulses.     Heart sounds: Normal heart sounds.  Pulmonary:     Effort: Pulmonary effort is normal.     Breath sounds: Normal breath sounds.  Abdominal:     General: Bowel sounds are normal.  Musculoskeletal:        General: Normal range of motion.     Cervical back: Normal range of motion.  Neurological:     General: No focal deficit present.     Mental Status: He is alert and oriented to person, place, and time. Mental status is at baseline.  Psychiatric:        Mood and Affect: Mood normal.        Behavior: Behavior normal.        Thought Content: Thought content normal.        Judgment: Judgment normal.      Results for orders placed  or performed in visit on 02/05/23  POCT Urinalysis Dipstick (81002)  Result Value Ref Range   Color, UA yellow    Clarity, UA cloudy    Glucose, UA Negative Negative   Bilirubin, UA negative    Ketones, UA negative    Spec Grav, UA 1.015 1.010 - 1.025   Blood, UA negative    pH, UA 7.5 5.0 - 8.0   Protein, UA Negative Negative   Urobilinogen, UA 1.0 0.2 or 1.0 E.U./dL   Nitrite, UA negative    Leukocytes, UA Negative Negative   Appearance     Odor      Recent Results (from the past 2160 hour(s))  Lipid panel     Status: Abnormal   Collection Time: 12/07/22  9:37 AM  Result Value Ref Range   Cholesterol, Total 234 (H) 100 - 199 mg/dL   Triglycerides 478 0 - 149 mg/dL   HDL 47 >29 mg/dL   VLDL Cholesterol Cal 20 5 - 40 mg/dL   LDL Chol Calc (NIH) 562 (H) 0 - 99 mg/dL   Chol/HDL Ratio 5.0 0.0 - 5.0 ratio    Comment:                                   T. Chol/HDL Ratio                                             Men  Women                               1/2 Avg.Risk  3.4    3.3                                   Avg.Risk  5.0    4.4                                2X Avg.Risk  9.6    7.1  3X Avg.Risk 23.4   11.0   VITAMIN D 25 Hydroxy (Vit-D Deficiency, Fractures)     Status: Abnormal   Collection Time: 12/07/22  9:37 AM  Result Value Ref Range   Vit D, 25-Hydroxy 17.4 (L) 30.0 - 100.0 ng/mL    Comment: Vitamin D deficiency has been defined by the Institute of Medicine and an Endocrine Society practice guideline as a level of serum 25-OH vitamin D less than 20 ng/mL (1,2). The Endocrine Society went on to further define vitamin D insufficiency as a level between 21 and 29 ng/mL (2). 1. IOM (Institute of Medicine). 2010. Dietary reference    intakes for calcium and D. [place]: The    [employer]. 2. Holick MF, [place]ley [place], Bischoff-Ferrari HA, et al.    Evaluation, treatment, and prevention of vitamin D    deficiency: an  Endocrine Society clinical practice    guideline. JCEM. 2011 Jul; 96(7):1911-30.   CMP14+EGFR     Status: Abnormal   Collection Time: 12/07/22  9:37 AM  Result Value Ref Range   Glucose 86 70 - 99 mg/dL   BUN 10 6 - 20 mg/dL   Creatinine, Ser 2.84 0.76 - 1.27 mg/dL   eGFR 88 >13 KG/MWN/0.27   BUN/Creatinine Ratio 8 (L) 9 - 20   Sodium 139 134 - 144 mmol/L   Potassium 4.7 3.5 - 5.2 mmol/L   Chloride 99 96 - 106 mmol/L   CO2 25 20 - 29 mmol/L   Calcium 9.9 8.7 - 10.2 mg/dL   Total Protein 7.6 6.0 - 8.5 g/dL   Albumin 4.6 4.3 - 5.2 g/dL   Globulin, Total 3.0 1.5 - 4.5 g/dL   Bilirubin Total 1.1 0.0 - 1.2 mg/dL   Alkaline Phosphatase 55 44 - 121 IU/L   AST 24 0 - 40 IU/L   ALT 35 0 - 44 IU/L  Hemoglobin A1c     Status: None   Collection Time: 12/07/22  9:37 AM  Result Value Ref Range   Hgb A1c MFr Bld 5.6 4.8 - 5.6 %    Comment:          Prediabetes: 5.7 - 6.4          Diabetes: >6.4          Glycemic control for adults with diabetes: <7.0    Est. average glucose Bld gHb Est-mCnc 114 mg/dL  Vitamin O53     Status: None   Collection Time: 12/07/22  9:37 AM  Result Value Ref Range   Vitamin B-12 594 232 - 1,245 pg/mL  POCT Urinalysis Dipstick (66440)     Status: Normal   Collection Time: 02/05/23 11:12 AM  Result Value Ref Range   Color, UA yellow    Clarity, UA cloudy    Glucose, UA Negative Negative   Bilirubin, UA negative    Ketones, UA negative    Spec Grav, UA 1.015 1.010 - 1.025   Blood, UA negative    pH, UA 7.5 5.0 - 8.0   Protein, UA Negative Negative   Urobilinogen, UA 1.0 0.2 or 1.0 E.U./dL   Nitrite, UA negative    Leukocytes, UA Negative Negative   Appearance     Odor          Assessment & Plan:    Routine Health Maintenance and Physical Exam  There is no immunization history for the selected administration types on file for this patient.  Health Maintenance  Topic Date Due   COVID-19  Vaccine (1) Never done   HPV VACCINES (1 - Male 3-dose  series) Never done   HIV Screening  Never done   DTaP/Tdap/Td (1 - Tdap) Never done   INFLUENZA VACCINE  Never done   Hepatitis C Screening  Discontinued    Discussed health benefits of physical activity, and encouraged him to engage in regular exercise appropriate for his age and condition.  Problem List Items Addressed This Visit       Active Problems   Hidradenitis    Patient stable.  Well controlled with current therapy.   Continue current meds.        Attention deficit hyperactivity disorder (ADHD), predominantly inattentive type    Patient stable.  Well controlled with current therapy.   Continue current meds.       Relevant Orders   CMP14+EGFR   CBC with Diff   Other Visit Diagnoses     B12 deficiency due to diet    -  Primary   Checking labs today.  Will continue supplements as needed.   Relevant Orders   CMP14+EGFR   Vitamin B12   CBC with Diff   Mixed hyperlipidemia       Checking labs today.  Continue current therapy for lipid control. Will modify as needed based on labwork results.   Relevant Orders   CMP14+EGFR   CBC with Diff   Prediabetes       A1C is in prediabetic ranges. Patient counseled on dietary choices and verbalized understanding. Will reassess at follow up after next lab check.   Relevant Orders   CMP14+EGFR   Hemoglobin A1c   CBC with Diff   Vitamin D deficiency, unspecified       Checking labs today.  Will continue supplements as needed.   Relevant Orders   VITAMIN D 25 Hydroxy (Vit-D Deficiency, Fractures)   CMP14+EGFR   CBC with Diff   Essential hypertension, benign       Blood pressure well controlled with current medications.  Continue current therapy.  Will reassess at follow up.   Relevant Orders   CMP14+EGFR   CBC with Diff   Other fatigue       Relevant Orders   CMP14+EGFR   TSH   CBC with Diff   Contact with and (suspected) exposure to infections with a predominantly sexual mode of transmission        Checking STI labs today. Will call patient with results   Relevant Orders   CMP14+EGFR   HSV 1 and 2 Ab, IgG   RPR w/reflex to TrepSure   Acute Viral Hepatitis (HAV, HBV, HCV)   HIV Antibody (routine testing w rflx)   CBC with Diff   Chlamydia/Gonococcus/Trichomonas, NAA   Routine general medical examination at a health care facility       Relevant Orders   Lipid panel   VITAMIN D 25 Hydroxy (Vit-D Deficiency, Fractures)   CMP14+EGFR   TSH   Hemoglobin A1c   Vitamin B12   HSV 1 and 2 Ab, IgG   RPR w/reflex to TrepSure   Acute Viral Hepatitis (HAV, HBV, HCV)   HIV Antibody (routine testing w rflx)   CBC with Diff   Sports physical       EKG in office normal. Patient is cleared for physical activity   Relevant Orders   EKG 12-Lead   POCT Urinalysis Dipstick (16109) (Completed)      Return in about 4 months (around 06/06/2023).     Alessio Bogan M  [place]bert [place], FNP  02/05/2023   This document may have been prepared by [facility] Voice Recognition software and as such may include unintentional dictation errors.

## 2023-02-08 ENCOUNTER — Ambulatory Visit: Admit: 2023-02-08 | Discharge: 2023-02-09 | Payer: PRIVATE HEALTH INSURANCE

## 2023-02-08 DIAGNOSIS — L732 Hidradenitis suppurativa: Principal | ICD-10-CM

## 2023-02-08 DIAGNOSIS — Z79899 Other long term (current) drug therapy: Principal | ICD-10-CM

## 2023-02-08 DIAGNOSIS — L910 Hypertrophic scar: Principal | ICD-10-CM

## 2023-02-08 LAB — CBC WITH DIFFERENTIAL/PLATELET
Basophils Absolute: 0 10*3/uL (ref 0.0–0.2)
Basos: 1 %
EOS (ABSOLUTE): 0 10*3/uL (ref 0.0–0.4)
Eos: 1 %
Hematocrit: 42.6 % (ref 37.5–51.0)
Hemoglobin: 14.3 g/dL (ref 13.0–17.7)
Immature Grans (Abs): 0 10*3/uL (ref 0.0–0.1)
Immature Granulocytes: 0 %
Lymphocytes Absolute: 1.9 10*3/uL (ref 0.7–3.1)
Lymphs: 50 %
MCH: 31.8 pg (ref 26.6–33.0)
MCHC: 33.6 g/dL (ref 31.5–35.7)
MCV: 95 fL (ref 79–97)
Monocytes Absolute: 0.3 10*3/uL (ref 0.1–0.9)
Monocytes: 9 %
Neutrophils Absolute: 1.4 10*3/uL (ref 1.4–7.0)
Neutrophils: 39 %
Platelets: 245 10*3/uL (ref 150–450)
RBC: 4.49 x10E6/uL (ref 4.14–5.80)
RDW: 11.9 % (ref 11.6–15.4)
WBC: 3.7 10*3/uL (ref 3.4–10.8)

## 2023-02-08 LAB — ACUTE VIRAL HEPATITIS (HAV, HBV, HCV)
HCV Ab: NONREACTIVE
Hep A IgM: NEGATIVE
Hep B C IgM: NEGATIVE
Hepatitis B Surface Ag: NEGATIVE

## 2023-02-08 LAB — LIPID PANEL
Chol/HDL Ratio: 5.3 {ratio} — ABNORMAL HIGH (ref 0.0–5.0)
Cholesterol, Total: 202 mg/dL — ABNORMAL HIGH (ref 100–199)
HDL: 38 mg/dL — ABNORMAL LOW (ref >39)
LDL Chol Calc (NIH): 149 mg/dL — ABNORMAL HIGH (ref 0–99)
Triglycerides: 81 mg/dL (ref 0–149)
VLDL Cholesterol Cal: 15 mg/dL (ref 5–40)

## 2023-02-08 LAB — HEMOGLOBIN A1C
Est. average glucose Bld gHb Est-mCnc: 114 mg/dL
Hgb A1c MFr Bld: 5.6 % (ref 4.8–5.6)

## 2023-02-08 LAB — CMP14+EGFR
ALT: 22 [IU]/L (ref 0–44)
AST: 21 [IU]/L (ref 0–40)
Albumin: 4.6 g/dL (ref 4.3–5.2)
Alkaline Phosphatase: 55 [IU]/L (ref 44–121)
BUN/Creatinine Ratio: 10 (ref 9–20)
BUN: 12 mg/dL (ref 6–20)
Bilirubin Total: 0.7 mg/dL (ref 0.0–1.2)
CO2: 23 mmol/L (ref 20–29)
Calcium: 9.8 mg/dL (ref 8.7–10.2)
Chloride: 101 mmol/L (ref 96–106)
Creatinine, Ser: 1.15 mg/dL (ref 0.76–1.27)
Globulin, Total: 3.1 g/dL (ref 1.5–4.5)
Glucose: 85 mg/dL (ref 70–99)
Potassium: 4.8 mmol/L (ref 3.5–5.2)
Sodium: 139 mmol/L (ref 134–144)
Total Protein: 7.7 g/dL (ref 6.0–8.5)
eGFR: 91 mL/min/{1.73_m2} (ref >59)

## 2023-02-08 LAB — HSV 1 AND 2 AB, IGG
HSV 1 Glycoprotein G Ab, IgG: NONREACTIVE
HSV 2 IgG, Type Spec: NONREACTIVE

## 2023-02-08 LAB — TREPONEMAL ANTIBODIES, TPPA: Treponemal Antibodies, TPPA: NONREACTIVE

## 2023-02-08 LAB — RPR W/REFLEX TO TREPSURE: RPR: NONREACTIVE

## 2023-02-08 LAB — VITAMIN B12: Vitamin B-12: 573 pg/mL (ref 232–1245)

## 2023-02-08 LAB — HCV INTERPRETATION

## 2023-02-08 LAB — VITAMIN D 25 HYDROXY (VIT D DEFICIENCY, FRACTURES): Vit D, 25-Hydroxy: 14.5 ng/mL — ABNORMAL LOW (ref 30.0–100.0)

## 2023-02-08 LAB — TSH: TSH: 1.41 u[IU]/mL (ref 0.450–4.500)

## 2023-02-08 LAB — HIV ANTIBODY (ROUTINE TESTING W REFLEX): HIV Screen 4th Generation wRfx: NONREACTIVE

## 2023-02-16 MED ORDER — SECUKINUMAB 300 MG/2 ML (150 MG/ML) SUBCUTANEOUS PEN INJECTOR
SUBCUTANEOUS | 0 refills | 0.00 days | Status: CP
Start: 2023-02-16 — End: ?

## 2023-02-22 ENCOUNTER — Other Ambulatory Visit: Payer: Self-pay | Admitting: Family

## 2023-02-23 MED ORDER — AMPHETAMINE-DEXTROAMPHETAMINE 20 MG PO TABS: 20.0000 mg | ORAL_TABLET | Freq: Two times a day (BID) | ORAL | 0 refills | Status: DC

## 2023-02-26 ENCOUNTER — Encounter: Payer: Self-pay | Admitting: Family

## 2023-03-01 MED ORDER — VITAMIN D (ERGOCALCIFEROL) 1.25 MG (50000 UNIT) PO CAPS: 50000.0000 [IU] | ORAL_CAPSULE | ORAL | 1 refills | Status: DC

## 2023-03-04 ENCOUNTER — Encounter: Payer: Self-pay | Admitting: Family

## 2023-03-04 ENCOUNTER — Ambulatory Visit (INDEPENDENT_AMBULATORY_CARE_PROVIDER_SITE_OTHER): Payer: PRIVATE HEALTH INSURANCE | Admitting: Family

## 2023-03-04 VITALS — BP 122/80 | HR 89 | Ht 69.0 in | Wt 184.4 lb

## 2023-03-04 DIAGNOSIS — N489 Disorder of penis, unspecified: Secondary | ICD-10-CM

## 2023-03-04 DIAGNOSIS — Z0130 Encounter for examination of blood pressure without abnormal findings: Secondary | ICD-10-CM

## 2023-03-04 NOTE — Progress Notes (Signed)
 Acute Office Visit  Subjective:     Patient ID: Luis Simon, male    DOB: [birth year], 28 y.o.   MRN: [id number]  Patient is in today for  Chief Complaint  Patient presents with   Follow-up    Follow up    Concerned about an area on his penis, says that it will become red and has been tender.   Also says that he will have dryness there occasionally, is using lotion to help manage this, but says it still bothers him frequently.  No itching, patient had STI testing last month and was negative.  Has been happening for a few weeks.   No other concerns today.       Review of Systems  Genitourinary:        Red area on penis, flaking skin, slightly raised.  All other systems reviewed and are negative.       Objective:    BP 122/80   Pulse 89   Ht 5' 9 (1.753 m)   Wt 184 lb 6.4 oz (83.6 kg)   SpO2 98%   BMI 27.23 kg/m   Physical Exam Vitals and nursing note reviewed.  Constitutional:      Appearance: Normal appearance. He is normal weight.  Eyes:     Pupils: Pupils are equal, round, and reactive to light.  Cardiovascular:     Rate and Rhythm: Normal rate and regular rhythm.     Pulses: Normal pulses.     Heart sounds: Normal heart sounds.  Pulmonary:     Effort: Pulmonary effort is normal.     Breath sounds: Normal breath sounds.  Neurological:     Mental Status: He is alert.  Psychiatric:        Mood and Affect: Mood normal.        Behavior: Behavior normal.     No results found for any visits on 03/04/23.  Recent Results (from the past 2160 hours)  Lipid panel     Status: Abnormal   Collection Time: 12/07/22  9:37 AM  Result Value Ref Range   Cholesterol, Total 234 (H) 100 - 199 mg/dL   Triglycerides 890 0 - 149 mg/dL   HDL 47 >60 mg/dL   VLDL Cholesterol Cal 20 5 - 40 mg/dL   LDL Chol Calc (NIH) 832 (H) 0 - 99 mg/dL   Chol/HDL Ratio 5.0 0.0 - 5.0 ratio    Comment:                                   T. Chol/HDL Ratio                                              Men  Women                               1/2 Avg.Risk  3.4    3.3                                   Avg.Risk  5.0    4.4  2X Avg.Risk  9.6    7.1                                3X Avg.Risk 23.4   11.0   VITAMIN D  25 Hydroxy (Vit-D Deficiency, Fractures)     Status: Abnormal   Collection Time: 12/07/22  9:37 AM  Result Value Ref Range   Vit D, 25-Hydroxy 17.4 (L) 30.0 - 100.0 ng/mL    Comment: Vitamin D  deficiency has been defined by the Institute of Medicine and an Endocrine Society practice guideline as a level of serum 25-OH vitamin D  less than 20 ng/mL (1,2). The Endocrine Society went on to further define vitamin D  insufficiency as a level between 21 and 29 ng/mL (2). 1. IOM (Institute of Medicine). 2010. Dietary reference    intakes for calcium  and D. [place]: The    [employer]. 2. Holick MF, Binkley Lomax, Bischoff-Ferrari HA, et al.    Evaluation, treatment, and prevention of vitamin D     deficiency: an Endocrine Society clinical practice    guideline. JCEM. 2011 Jul; 96(7):1911-30.   CMP14+EGFR     Status: Abnormal   Collection Time: 12/07/22  9:37 AM  Result Value Ref Range   Glucose 86 70 - 99 mg/dL   BUN 10 6 - 20 mg/dL   Creatinine, Ser 8.81 0.76 - 1.27 mg/dL   eGFR 88 >40 fO/fpw/8.26   BUN/Creatinine Ratio 8 (L) 9 - 20   Sodium 139 134 - 144 mmol/L   Potassium 4.7 3.5 - 5.2 mmol/L   Chloride 99 96 - 106 mmol/L   CO2 25 20 - 29 mmol/L   Calcium  9.9 8.7 - 10.2 mg/dL   Total Protein 7.6 6.0 - 8.5 g/dL   Albumin 4.6 4.3 - 5.2 g/dL   Globulin, Total 3.0 1.5 - 4.5 g/dL   Bilirubin Total 1.1 0.0 - 1.2 mg/dL   Alkaline Phosphatase 55 44 - 121 IU/L   AST 24 0 - 40 IU/L   ALT 35 0 - 44 IU/L  Hemoglobin A1c     Status: None   Collection Time: 12/07/22  9:37 AM  Result Value Ref Range   Hgb A1c MFr Bld 5.6 4.8 - 5.6 %    Comment:          Prediabetes: 5.7 - 6.4          Diabetes: >6.4           Glycemic control for adults with diabetes: <7.0    Est. average glucose Bld gHb Est-mCnc 114 mg/dL  Vitamin B12     Status: None   Collection Time: 12/07/22  9:37 AM  Result Value Ref Range   Vitamin B-12 594 232 - 1,245 pg/mL  Lipid panel     Status: Abnormal   Collection Time: 02/05/23 11:06 AM  Result Value Ref Range   Cholesterol, Total 202 (H) 100 - 199 mg/dL   Triglycerides 81 0 - 149 mg/dL   HDL 38 (L) >60 mg/dL   VLDL Cholesterol Cal 15 5 - 40 mg/dL   LDL Chol Calc (NIH) 850 (H) 0 - 99 mg/dL   Chol/HDL Ratio 5.3 (H) 0.0 - 5.0 ratio    Comment:                                   T. Chol/HDL Ratio  Men  Women                               1/2 Avg.Risk  3.4    3.3                                   Avg.Risk  5.0    4.4                                2X Avg.Risk  9.6    7.1                                3X Avg.Risk 23.4   11.0   VITAMIN D  25 Hydroxy (Vit-D Deficiency, Fractures)     Status: Abnormal   Collection Time: 02/05/23 11:06 AM  Result Value Ref Range   Vit D, 25-Hydroxy 14.5 (L) 30.0 - 100.0 ng/mL    Comment: Vitamin D  deficiency has been defined by the Institute of Medicine and an Endocrine Society practice guideline as a level of serum 25-OH vitamin D  less than 20 ng/mL (1,2). The Endocrine Society went on to further define vitamin D  insufficiency as a level between 21 and 29 ng/mL (2). 1. IOM (Institute of Medicine). 2010. Dietary reference    intakes for calcium  and D. [place]: The    [employer]. 2. Holick MF, Binkley , Bischoff-Ferrari HA, et al.    Evaluation, treatment, and prevention of vitamin D     deficiency: an Endocrine Society clinical practice    guideline. JCEM. 2011 Jul; 96(7):1911-30.   CMP14+EGFR     Status: None   Collection Time: 02/05/23 11:06 AM  Result Value Ref Range   Glucose 85 70 - 99 mg/dL   BUN 12 6 - 20 mg/dL   Creatinine, Ser 8.84 0.76 - 1.27 mg/dL   eGFR 91 >40  fO/fpw/8.26   BUN/Creatinine Ratio 10 9 - 20   Sodium 139 134 - 144 mmol/L   Potassium 4.8 3.5 - 5.2 mmol/L   Chloride 101 96 - 106 mmol/L   CO2 23 20 - 29 mmol/L   Calcium  9.8 8.7 - 10.2 mg/dL   Total Protein 7.7 6.0 - 8.5 g/dL   Albumin 4.6 4.3 - 5.2 g/dL   Globulin, Total 3.1 1.5 - 4.5 g/dL   Bilirubin Total 0.7 0.0 - 1.2 mg/dL   Alkaline Phosphatase 55 44 - 121 IU/L   AST 21 0 - 40 IU/L   ALT 22 0 - 44 IU/L  TSH     Status: None   Collection Time: 02/05/23 11:06 AM  Result Value Ref Range   TSH 1.410 0.450 - 4.500 uIU/mL  Hemoglobin A1c     Status: None   Collection Time: 02/05/23 11:06 AM  Result Value Ref Range   Hgb A1c MFr Bld 5.6 4.8 - 5.6 %    Comment:          Prediabetes: 5.7 - 6.4          Diabetes: >6.4          Glycemic control for adults with diabetes: <7.0    Est. average glucose Bld gHb Est-mCnc 114 mg/dL  Vitamin B12     Status: None   Collection Time:  02/05/23 11:06 AM  Result Value Ref Range   Vitamin B-12 573 232 - 1,245 pg/mL  HSV 1 and 2 Ab, IgG     Status: None   Collection Time: 02/05/23 11:06 AM  Result Value Ref Range   HSV 1 Glycoprotein G Ab, IgG Non Reactive Non Reactive    Comment: **Please note reference interval change** HSV-1 IgG testing performed using the Roche Elecsys HSV-1 IgG assay.    HSV 2 IgG, Type Spec Non Reactive Non Reactive    Comment: **Please note reference interval change** Current guidelines and recommendations do not recommend routine screening for HSV-2 in asymptomatic individuals, including those that are pregnant. The detection of HSV-2 IgG antibodies in a single sample indicates previous exposure to HSV-2 but does not give information as to the site of HSV infection or the timing of exposure. The predictive value of positive and negative results depends on the population's prevalence and the pretest likelihood of HSV-2. HSV-2 IgG testing performed using the Roche Elecsys HSV-2 IgG assay.   RPR w/reflex to  TrepSure     Status: None   Collection Time: 02/05/23 11:06 AM  Result Value Ref Range   RPR Non Reactive Non Reactive  Acute Viral Hepatitis (HAV, HBV, HCV)     Status: None   Collection Time: 02/05/23 11:06 AM  Result Value Ref Range   Hep A IgM Negative Negative    Comment: A negative anti-HAV IgM result suggests no recent or current HAV infection.    Hepatitis B Surface Ag Negative Negative   Hep B C IgM Negative Negative   HCV Ab Non Reactive Non Reactive  HIV Antibody (routine testing w rflx)     Status: None   Collection Time: 02/05/23 11:06 AM  Result Value Ref Range   HIV Screen 4th Generation wRfx Non Reactive Non Reactive    Comment: HIV-1/HIV-2 antibodies and HIV-1 p24 antigen were NOT detected. There is no laboratory evidence of HIV infection. HIV Negative   CBC with Diff     Status: None   Collection Time: 02/05/23 11:06 AM  Result Value Ref Range   WBC 3.7 3.4 - 10.8 x10E3/uL   RBC 4.49 4.14 - 5.80 x10E6/uL   Hemoglobin 14.3 13.0 - 17.7 g/dL   Hematocrit 57.3 62.4 - 51.0 %   MCV 95 79 - 97 fL   MCH 31.8 26.6 - 33.0 pg   MCHC 33.6 31.5 - 35.7 g/dL   RDW 88.0 88.3 - 84.5 %   Platelets 245 150 - 450 x10E3/uL   Neutrophils 39 Not Estab. %   Lymphs 50 Not Estab. %   Monocytes 9 Not Estab. %   Eos 1 Not Estab. %   Basos 1 Not Estab. %   Neutrophils Absolute 1.4 1.4 - 7.0 x10E3/uL   Lymphocytes Absolute 1.9 0.7 - 3.1 x10E3/uL   Monocytes Absolute 0.3 0.1 - 0.9 x10E3/uL   EOS (ABSOLUTE) 0.0 0.0 - 0.4 x10E3/uL   Basophils Absolute 0.0 0.0 - 0.2 x10E3/uL   Immature Granulocytes 0 Not Estab. %   Immature Grans (Abs) 0.0 0.0 - 0.1 x10E3/uL  Interpretation:     Status: None   Collection Time: 02/05/23 11:06 AM  Result Value Ref Range   HCV Interp 1: Comment     Comment: Not infected with HCV unless early or acute infection is suspected (which may be delayed in an immunocompromised individual), or other evidence exists to indicate HCV infection.   Treponemal  Antibodies, TPPA  Status: None   Collection Time: 02/05/23 11:06 AM  Result Value Ref Range   Treponemal Antibodies, TPPA Non Reactive Non Reactive   Interpretation: Comment     Comment: Syphilis: Treponemal Antibodies with Reflex to RPR and RPR           Titer, Reverse Screening and Diagnosis Algorithm ------------------------------------------------------------ Treponemal              Treponemal     Ab       RPR, Qn     Ab, TPPA    Final Interpretation ----------   --------   ----------   ----------------------- Non          N/A        N/A          No laboratory evidence Reactive                             of syphilis. Retest in                                      2-4 weeks if recent                                      exposure is suspected. ----------   --------   ----------   ----------------------- Reactive     Non        Non          Treponemal antibodies              Reactive   Reactive     not confirmed.                                      Inconclusive for                                      syphilis; potential                                      early syphilis,                                      possible false                                       positive. Retest in 2-4                                      weeks if recent                                      exposure is suspected. ----------   --------   ----------   ----------------------- Reactive  Non        Reactive     Treponemal antibodies              Reactive                detected. Consistent                                      with past or current                                      (potential early)                                      syphilis. ----------   --------   ----------   ----------------------- Reactive     >/=1:1     N/A          Treponemal and                                      nontreponemal                                      antibodies detected.                                       Consistent with current                                      or past syphilis. This test is intended ONLY for specimens that have tested positive (reactive) or equivocal for Treponema pallidum antibodies prior to submission for testing.  For the full CDC-recommended syphilis screening and diagnosis algorithm, [facility] offers test code 012005 RPR, Rfx Qn RPR/Confirm TP or 917654 T pallidum Screening Cascade.   POCT Urinalysis Dipstick (18997)     Status: Normal   Collection Time: 02/05/23 11:12 AM  Result Value Ref Range   Color, UA yellow    Clarity, UA cloudy    Glucose, UA Negative Negative   Bilirubin, UA negative    Ketones, UA negative    Spec Grav, UA 1.015 1.010 - 1.025   Blood, UA negative    pH, UA 7.5 5.0 - 8.0   Protein, UA Negative Negative   Urobilinogen, UA 1.0 0.2 or 1.0 E.U./dL   Nitrite, UA negative    Leukocytes, UA Negative Negative   Appearance     Odor    Chlamydia/Gonococcus/Trichomonas, NAA     Status: None   Collection Time: 02/05/23  1:58 PM   Specimen: Urine   UC  Result Value Ref Range   Chlamydia by NAA Negative Negative   Gonococcus by NAA Negative Negative   Trich vag by NAA Negative Negative    Allergies as of 03/04/2023   No Known Allergies      Medication List        Accurate as of March 04, 2023  6:50 PM. If you have any questions, ask your nurse or doctor.          amphetamine -dextroamphetamine  20 MG tablet Commonly known as: ADDERALL Take 1 tablet (20 mg total) by mouth 2 (two) times daily.   Cosentyx UnoReady 300 MG/2ML Soaj Generic drug: Secukinumab Inject 300 mg into the skin.   fexofenadine  180 MG tablet Commonly known as: ALLEGRA  Take 1 tablet (180 mg total) by mouth daily.   methotrexate  2.5 MG tablet Commonly known as: RHEUMATREX Take 15 mg by mouth once a week.   triamcinolone acetonide 40 MG/ML injection Commonly known as: KENALOG-40 Inject 40 mg into the articular space.   Vitamin D   (Ergocalciferol ) 1.25 MG (50000 UNIT) Caps capsule Commonly known as: DRISDOL  Take 1 capsule (50,000 Units total) by mouth every 7 (seven) days.            Assessment & Plan:   Problem List Items Addressed This Visit   None Visit Diagnoses       Penile lesion    -  Primary   Sending swab of area to evaluate for possible HSV. Will call patient with results when available.  In the meantime, suggested OTC Antifungal cream in case.   Relevant Orders   Herpes Culture      Return as previously scheduled unless not improving.  Total time spent: 20 minutes  ALAN CHRISTELLA ARRANT, FNP  03/04/2023   This document may have been prepared by [facility] Voice Recognition software and as such may include unintentional dictation errors.

## 2023-03-07 LAB — HERPES SIMPLEX VIRUS CULTURE

## 2023-03-08 ENCOUNTER — Encounter: Payer: Self-pay | Admitting: Family

## 2023-03-10 ENCOUNTER — Ambulatory Visit: Payer: PRIVATE HEALTH INSURANCE | Admitting: Family

## 2023-03-24 ENCOUNTER — Other Ambulatory Visit: Payer: Self-pay | Admitting: Family

## 2023-03-25 MED ORDER — AMPHETAMINE-DEXTROAMPHETAMINE 20 MG PO TABS: 20.0000 mg | ORAL_TABLET | Freq: Two times a day (BID) | ORAL | 0 refills | Status: DC

## 2023-04-20 ENCOUNTER — Other Ambulatory Visit: Payer: Self-pay | Admitting: Internal Medicine

## 2023-04-23 MED ORDER — AMPHETAMINE-DEXTROAMPHETAMINE 20 MG PO TABS: 20.0000 mg | ORAL_TABLET | Freq: Two times a day (BID) | ORAL | 0 refills | Status: DC

## 2023-05-18 ENCOUNTER — Other Ambulatory Visit: Payer: Self-pay | Admitting: Family

## 2023-05-19 MED ORDER — AMPHETAMINE-DEXTROAMPHETAMINE 20 MG PO TABS: 20.0000 mg | ORAL_TABLET | Freq: Two times a day (BID) | ORAL | 0 refills | Status: DC

## 2023-06-07 ENCOUNTER — Ambulatory Visit: Payer: PRIVATE HEALTH INSURANCE | Admitting: Family

## 2023-06-08 ENCOUNTER — Encounter: Payer: Self-pay | Admitting: Family

## 2023-06-08 ENCOUNTER — Ambulatory Visit (INDEPENDENT_AMBULATORY_CARE_PROVIDER_SITE_OTHER): Payer: PRIVATE HEALTH INSURANCE | Admitting: Family

## 2023-06-08 VITALS — BP 120/76 | HR 83 | Ht 69.0 in | Wt 180.4 lb

## 2023-06-08 DIAGNOSIS — Z0130 Encounter for examination of blood pressure without abnormal findings: Secondary | ICD-10-CM

## 2023-06-08 DIAGNOSIS — F900 Attention-deficit hyperactivity disorder, predominantly inattentive type: Secondary | ICD-10-CM

## 2023-06-08 DIAGNOSIS — L732 Hidradenitis suppurativa: Secondary | ICD-10-CM

## 2023-06-08 NOTE — Patient Instructions (Addendum)
 Navage - look for the "reusable silicone navage salt pod" on [employer] - youtube navage hacks  Zaditor eye drops

## 2023-06-08 NOTE — Progress Notes (Signed)
 Established Patient Office Visit  Subjective:  Patient ID: Luis Simon, male    DOB: 1995/04/18  Age: 28 y.o. MRN: [id number]  Chief Complaint  Patient presents with   Follow-up    4 month follow up    Patient is here today for his 4 months follow up.  He has been feeling well since last appointment.   He does not have additional concerns to discuss today.  Labs are not due today. He needs refills.   I have reviewed his active problem list, medication list, allergies, health maintenance, notes from last encounter, lab results for his appointment today.    No other concerns at this time.   Past Medical History:  Diagnosis Date   Acne 12/27/2012   ADHD    Hidradenitis 07/22/2015    Past Surgical History:  Procedure Laterality Date   none     SKIN SURGERY Right    right arm anfd left groin    Social History   Socioeconomic History   Marital status: Single    Spouse name: Not on file   Number of children: Not on file   Years of education: Not on file   Highest education level: Not on file  Occupational History   Not on file  Tobacco Use   Smoking status: Never   Smokeless tobacco: Never  Vaping Use   Vaping status: Never Used  Substance and Sexual Activity   Alcohol use: No   Drug use: No   Sexual activity: Yes    Birth control/protection: Condom  Other Topics Concern   Not on file  Social History Narrative   Not on file   Social Drivers of Health   Financial Resource Strain: Not on file  Food Insecurity: Not on file  Transportation Needs: Not on file  Physical Activity: Not on file  Stress: Not on file  Social Connections: Not on file  Intimate Partner Violence: Not on file    Family History  Family history unknown: Yes    No Known Allergies  Review of Systems  All other systems reviewed and are negative.      Objective:   BP 120/76   Pulse 83   Ht 5' 9 (1.753 m)   Wt 180 lb 6.4 oz (81.8 kg)   SpO2 98%   BMI 26.64 kg/m    Vitals:   06/08/23 1016  BP: 120/76  Pulse: 83  Height: 5' 9 (1.753 m)  Weight: 180 lb 6.4 oz (81.8 kg)  SpO2: 98%  BMI (Calculated): 26.63    Physical Exam Vitals and nursing note reviewed.  Constitutional:      Appearance: Normal appearance. He is normal weight.  Eyes:     Pupils: Pupils are equal, round, and reactive to light.  Cardiovascular:     Rate and Rhythm: Normal rate and regular rhythm.     Pulses: Normal pulses.     Heart sounds: Normal heart sounds.  Pulmonary:     Effort: Pulmonary effort is normal.     Breath sounds: Normal breath sounds.  Neurological:     General: No focal deficit present.     Mental Status: He is alert and oriented to person, place, and time. Mental status is at baseline.  Psychiatric:        Mood and Affect: Mood normal.        Behavior: Behavior normal.        Thought Content: Thought content normal.  Judgment: Judgment normal.      No results found for any visits on 06/08/23.  No results found for this or any previous visit (from the past 2160 hours).     Assessment & Plan Hidradenitis Patient stable.  Well controlled with current therapy.   Continue current meds.   Attention deficit hyperactivity disorder (ADHD), predominantly inattentive type Patient stable.  Well controlled with current therapy.   Continue current meds.      Return in about 4 months (around 10/08/2023).   Total time spent: 20 minutes  ALAN CHRISTELLA ARRANT, FNP  06/08/2023   This document may have been prepared by [facility] Voice Recognition software and as such may include unintentional dictation errors.

## 2023-06-17 ENCOUNTER — Other Ambulatory Visit: Payer: Self-pay | Admitting: Family

## 2023-06-21 MED ORDER — AMPHETAMINE-DEXTROAMPHETAMINE 20 MG PO TABS: 20.0000 mg | ORAL_TABLET | Freq: Two times a day (BID) | ORAL | 0 refills | Status: DC

## 2023-07-19 ENCOUNTER — Other Ambulatory Visit: Payer: Self-pay

## 2023-07-19 MED ORDER — AMPHETAMINE-DEXTROAMPHETAMINE 20 MG PO TABS: 20.0000 mg | ORAL_TABLET | Freq: Two times a day (BID) | ORAL | 0 refills | Status: DC

## 2023-08-19 ENCOUNTER — Other Ambulatory Visit: Payer: Self-pay | Admitting: Family

## 2023-08-24 DIAGNOSIS — S161XXA Strain of muscle, fascia and tendon at neck level, initial encounter: Secondary | ICD-10-CM | POA: Insufficient documentation

## 2023-08-24 DIAGNOSIS — M542 Cervicalgia: Secondary | ICD-10-CM | POA: Insufficient documentation

## 2023-08-25 ENCOUNTER — Other Ambulatory Visit: Payer: Self-pay | Admitting: Family

## 2023-08-25 MED ORDER — AMPHETAMINE-DEXTROAMPHETAMINE 20 MG PO TABS: 20.0000 mg | ORAL_TABLET | Freq: Two times a day (BID) | ORAL | 0 refills | Status: DC

## 2023-09-02 ENCOUNTER — Encounter: Payer: Self-pay | Admitting: Family

## 2023-09-02 ENCOUNTER — Ambulatory Visit (INDEPENDENT_AMBULATORY_CARE_PROVIDER_SITE_OTHER): Payer: PRIVATE HEALTH INSURANCE | Admitting: Family

## 2023-09-02 VITALS — BP 108/78 | HR 80 | Ht 69.0 in | Wt 178.2 lb

## 2023-09-02 DIAGNOSIS — R7303 Prediabetes: Secondary | ICD-10-CM

## 2023-09-02 DIAGNOSIS — I10 Essential (primary) hypertension: Secondary | ICD-10-CM | POA: Diagnosis not present

## 2023-09-02 DIAGNOSIS — Z202 Contact with and (suspected) exposure to infections with a predominantly sexual mode of transmission: Secondary | ICD-10-CM

## 2023-09-02 DIAGNOSIS — E559 Vitamin D deficiency, unspecified: Secondary | ICD-10-CM

## 2023-09-02 DIAGNOSIS — E538 Deficiency of other specified B group vitamins: Secondary | ICD-10-CM

## 2023-09-02 DIAGNOSIS — E782 Mixed hyperlipidemia: Secondary | ICD-10-CM

## 2023-09-02 DIAGNOSIS — R5383 Other fatigue: Secondary | ICD-10-CM | POA: Diagnosis not present

## 2023-09-02 NOTE — Progress Notes (Signed)
 Established Patient Office Visit  Subjective:  Patient ID: Luis Simon, male    DOB: 07-20-1995  Age: 28 y.o. MRN: [id number]  Chief Complaint  Patient presents with   Follow-up    Patient is here today for his 4 months follow up.  He has been feeling fairly well since last appointment.   He does have additional concerns to discuss today.  He asks if we can check sti labs on him again, as he has a new partner and would like to be safe.   Labs are due today.  He needs refills.   I have reviewed his active problem list, medication list, allergies, notes from last encounter, lab results for his appointment today.     No other concerns at this time.   Past Medical History:  Diagnosis Date   Acne 12/27/2012   ADHD    Hidradenitis 07/22/2015    Past Surgical History:  Procedure Laterality Date   none     SKIN SURGERY Right    right arm anfd left groin    Social History   Socioeconomic History   Marital status: Single    Spouse name: Not on file   Number of children: Not on file   Years of education: Not on file   Highest education level: Not on file  Occupational History   Not on file  Tobacco Use   Smoking status: Never   Smokeless tobacco: Never  Vaping Use   Vaping status: Never Used  Substance and Sexual Activity   Alcohol use: No   Drug use: No   Sexual activity: Yes    Birth control/protection: Condom  Other Topics Concern   Not on file  Social History Narrative   Not on file   Social Drivers of Health   Financial Resource Strain: Not on file  Food Insecurity: Not on file  Transportation Needs: Not on file  Physical Activity: Not on file  Stress: Not on file  Social Connections: Not on file  Intimate Partner Violence: Not on file    Family History  Family history unknown: Yes    No Known Allergies  Review of Systems  All other systems reviewed and are negative.      Objective:   BP 108/78   Pulse 80   Ht 5' 9 (1.753  m)   Wt 178 lb 3.2 oz (80.8 kg)   SpO2 97%   BMI 26.32 kg/m   Vitals:   09/02/23 1143  BP: 108/78  Pulse: 80  Height: 5' 9 (1.753 m)  Weight: 178 lb 3.2 oz (80.8 kg)  SpO2: 97%  BMI (Calculated): 26.3    Physical Exam Vitals and nursing note reviewed.  Constitutional:      Appearance: Normal appearance. He is normal weight.  Eyes:     Pupils: Pupils are equal, round, and reactive to light.  Cardiovascular:     Rate and Rhythm: Normal rate and regular rhythm.     Pulses: Normal pulses.     Heart sounds: Normal heart sounds.  Pulmonary:     Effort: Pulmonary effort is normal.     Breath sounds: Normal breath sounds.  Neurological:     General: No focal deficit present.     Mental Status: He is alert and oriented to person, place, and time. Mental status is at baseline.  Psychiatric:        Mood and Affect: Mood normal.        Behavior: Behavior normal.  Thought Content: Thought content normal.        Judgment: Judgment normal.      No results found for any visits on 09/02/23.  No results found for this or any previous visit (from the past 2160 hours).     Assessment & Plan Vitamin D  deficiency, unspecified Other fatigue B12 deficiency due to diet Checking labs today.  Will continue supplements as needed.   - Vitamin D  - Vitamin B12 - TSH  Mixed hyperlipidemia Checking labs today.  Continue current therapy for lipid control. Will modify as needed based on labwork results.   -CMP w/eGFR -Lipid Panel  Essential hypertension, benign Blood pressure well controlled with current medications.  Continue current therapy.  Will reassess at follow up.   - CBC w/Diff - CMP w/eGFR  Prediabetes A1C Continues to be in prediabetic ranges.  Will reassess at follow up after next lab check.  Patient counseled on dietary choices and verbalized understanding.   -CBC w/Diff -CMP w/eGFR -Hemoglobin A1C  Contact with and (suspected) exposure to infections  with a predominantly sexual mode of transmission Sending lab orders.  Will call with results when available.      Return in about 6 months (around 03/04/2024) for F/U.   Total time spent: 20 minutes  ALAN CHRISTELLA ARRANT, FNP  09/02/2023   This document may have been prepared by [facility] Voice Recognition software and as such may include unintentional dictation errors.

## 2023-09-03 LAB — CBC WITH DIFFERENTIAL/PLATELET
Basophils Absolute: 0 x10E3/uL (ref 0.0–0.2)
Basos: 1 %
EOS (ABSOLUTE): 0 x10E3/uL (ref 0.0–0.4)
Eos: 0 %
Hematocrit: 42.6 % (ref 37.5–51.0)
Hemoglobin: 14.3 g/dL (ref 13.0–17.7)
Immature Grans (Abs): 0 x10E3/uL (ref 0.0–0.1)
Immature Granulocytes: 0 %
Lymphocytes Absolute: 2.1 x10E3/uL (ref 0.7–3.1)
Lymphs: 46 %
MCH: 32.1 pg (ref 26.6–33.0)
MCHC: 33.6 g/dL (ref 31.5–35.7)
MCV: 96 fL (ref 79–97)
Monocytes Absolute: 0.4 x10E3/uL (ref 0.1–0.9)
Monocytes: 8 %
Neutrophils Absolute: 2 x10E3/uL (ref 1.4–7.0)
Neutrophils: 45 %
Platelets: 221 x10E3/uL (ref 150–450)
RBC: 4.45 x10E6/uL (ref 4.14–5.80)
RDW: 12.4 % (ref 11.6–15.4)
WBC: 4.5 x10E3/uL (ref 3.4–10.8)

## 2023-09-03 LAB — VITAMIN D 25 HYDROXY (VIT D DEFICIENCY, FRACTURES): Vit D, 25-Hydroxy: 25.8 ng/mL — ABNORMAL LOW (ref 30.0–100.0)

## 2023-09-03 LAB — CMP14+EGFR
ALT: 23 IU/L (ref 0–44)
AST: 28 IU/L (ref 0–40)
Albumin: 4.7 g/dL (ref 4.3–5.2)
Alkaline Phosphatase: 53 IU/L (ref 44–121)
BUN/Creatinine Ratio: 11 (ref 9–20)
BUN: 12 mg/dL (ref 6–20)
Bilirubin Total: 0.9 mg/dL (ref 0.0–1.2)
CO2: 22 mmol/L (ref 20–29)
Calcium: 10.1 mg/dL (ref 8.7–10.2)
Chloride: 101 mmol/L (ref 96–106)
Creatinine, Ser: 1.14 mg/dL (ref 0.76–1.27)
Globulin, Total: 3 g/dL (ref 1.5–4.5)
Glucose: 81 mg/dL (ref 70–99)
Potassium: 4.5 mmol/L (ref 3.5–5.2)
Sodium: 140 mmol/L (ref 134–144)
Total Protein: 7.7 g/dL (ref 6.0–8.5)
eGFR: 92 mL/min/1.73 (ref >59)

## 2023-09-03 LAB — TSH: TSH: 1.5 u[IU]/mL (ref 0.450–4.500)

## 2023-09-03 LAB — LIPID PANEL
Chol/HDL Ratio: 4.5 ratio (ref 0.0–5.0)
Cholesterol, Total: 194 mg/dL (ref 100–199)
HDL: 43 mg/dL (ref >39)
LDL Chol Calc (NIH): 141 mg/dL — ABNORMAL HIGH (ref 0–99)
Triglycerides: 56 mg/dL (ref 0–149)
VLDL Cholesterol Cal: 10 mg/dL (ref 5–40)

## 2023-09-03 LAB — HEMOGLOBIN A1C
Est. average glucose Bld gHb Est-mCnc: 111 mg/dL
Hgb A1c MFr Bld: 5.5 % (ref 4.8–5.6)

## 2023-09-03 LAB — VITAMIN B12: Vitamin B-12: 544 pg/mL (ref 232–1245)

## 2023-09-04 ENCOUNTER — Encounter: Payer: Self-pay | Admitting: Family

## 2023-09-04 LAB — CHLAMYDIA/GONOCOCCUS/TRICHOMONAS, NAA
Chlamydia by NAA: NEGATIVE
Gonococcus by NAA: NEGATIVE
Trich vag by NAA: NEGATIVE

## 2023-09-04 NOTE — Assessment & Plan Note (Signed)
 Patient stable.  Well controlled with current therapy.   Continue current meds.

## 2023-09-07 LAB — ACUTE VIRAL HEPATITIS (HAV, HBV, HCV)
HCV Ab: NONREACTIVE
Hep A IgM: NEGATIVE
Hep B C IgM: NEGATIVE
Hepatitis B Surface Ag: NEGATIVE

## 2023-09-07 LAB — HCV INTERPRETATION

## 2023-09-07 LAB — RPR W/REFLEX TO TREPSURE: RPR: NONREACTIVE

## 2023-09-07 LAB — HIV ANTIBODY (ROUTINE TESTING W REFLEX): HIV Screen 4th Generation wRfx: NONREACTIVE

## 2023-09-07 LAB — TREPONEMAL ANTIBODIES, TPPA: Treponemal Antibodies, TPPA: NONREACTIVE

## 2023-09-22 ENCOUNTER — Other Ambulatory Visit: Payer: Self-pay

## 2023-09-25 MED ORDER — AMPHETAMINE-DEXTROAMPHETAMINE 20 MG PO TABS: 20.0000 mg | ORAL_TABLET | Freq: Two times a day (BID) | ORAL | 0 refills | Status: DC

## 2023-09-30 ENCOUNTER — Ambulatory Visit: Payer: Self-pay

## 2023-10-01 ENCOUNTER — Other Ambulatory Visit: Payer: Self-pay

## 2023-10-01 MED ORDER — ROSUVASTATIN CALCIUM 10 MG PO TABS: 10.0000 mg | ORAL_TABLET | Freq: Every day | ORAL | 3 refills | Status: AC

## 2023-10-07 ENCOUNTER — Ambulatory Visit: Payer: PRIVATE HEALTH INSURANCE | Admitting: Family

## 2023-10-26 ENCOUNTER — Other Ambulatory Visit: Payer: Self-pay

## 2023-10-26 MED ORDER — AMPHETAMINE-DEXTROAMPHETAMINE 20 MG PO TABS: 20.0000 mg | ORAL_TABLET | Freq: Two times a day (BID) | ORAL | 0 refills | Status: DC

## 2023-11-23 ENCOUNTER — Other Ambulatory Visit: Payer: Self-pay | Admitting: Family

## 2023-11-25 MED ORDER — AMPHETAMINE-DEXTROAMPHETAMINE 20 MG PO TABS: 20.0000 mg | ORAL_TABLET | Freq: Two times a day (BID) | ORAL | 0 refills | Status: DC

## 2023-12-27 ENCOUNTER — Encounter: Payer: Self-pay | Admitting: Family

## 2023-12-27 ENCOUNTER — Other Ambulatory Visit: Payer: Self-pay | Admitting: Family

## 2023-12-30 MED ORDER — AMPHETAMINE-DEXTROAMPHETAMINE 20 MG PO TABS: 20.0000 mg | ORAL_TABLET | Freq: Two times a day (BID) | ORAL | 0 refills | Status: AC

## 2024-01-31 ENCOUNTER — Encounter: Payer: Self-pay | Admitting: Family

## 2024-03-09 ENCOUNTER — Ambulatory Visit: Payer: PRIVATE HEALTH INSURANCE | Admitting: Family

## 2024-03-09 VITALS — BP 120/76 | HR 84 | Ht 69.0 in | Wt 178.2 lb

## 2024-03-09 DIAGNOSIS — E782 Mixed hyperlipidemia: Secondary | ICD-10-CM

## 2024-03-09 DIAGNOSIS — E538 Deficiency of other specified B group vitamins: Secondary | ICD-10-CM

## 2024-03-09 DIAGNOSIS — R5383 Other fatigue: Secondary | ICD-10-CM

## 2024-03-09 DIAGNOSIS — R7303 Prediabetes: Secondary | ICD-10-CM

## 2024-03-09 DIAGNOSIS — F900 Attention-deficit hyperactivity disorder, predominantly inattentive type: Secondary | ICD-10-CM

## 2024-03-09 DIAGNOSIS — E559 Vitamin D deficiency, unspecified: Secondary | ICD-10-CM

## 2024-03-09 DIAGNOSIS — I10 Essential (primary) hypertension: Secondary | ICD-10-CM

## 2024-03-09 DIAGNOSIS — L732 Hidradenitis suppurativa: Secondary | ICD-10-CM

## 2024-03-09 MED ORDER — AMOXICILLIN-POT CLAVULANATE 875-125 MG PO TABS: 1.0000 | ORAL_TABLET | Freq: Two times a day (BID) | ORAL | 0 refills | Status: AC

## 2024-03-09 NOTE — Progress Notes (Unsigned)
 "  Established Patient Office Visit  Subjective:  Patient ID: Luis Simon, male    DOB: Jul 10, 1995  Age: 29 y.o. MRN: [id number]  Chief Complaint  Patient presents with   Follow-up    4 month follow up    HPI  No other concerns at this time.   Past Medical History:  Diagnosis Date   Acne 12/27/2012   ADHD    Hidradenitis 07/22/2015    Past Surgical History:  Procedure Laterality Date   none     SKIN SURGERY Right    right arm anfd left groin    Social History   Socioeconomic History   Marital status: Single    Spouse name: Not on file   Number of children: Not on file   Years of education: Not on file   Highest education level: Not on file  Occupational History   Not on file  Tobacco Use   Smoking status: Never   Smokeless tobacco: Never  Vaping Use   Vaping status: Never Used  Substance and Sexual Activity   Alcohol use: No   Drug use: No   Sexual activity: Yes    Birth control/protection: Condom  Other Topics Concern   Not on file  Social History Narrative   Not on file   Social Drivers of Health   Tobacco Use: Low Risk (09/04/2023)   Patient History    Smoking Tobacco Use: Never    Smokeless Tobacco Use: Never    Passive Exposure: Not on file  Financial Resource Strain: Not on file  Food Insecurity: Not on file  Transportation Needs: Not on file  Physical Activity: Not on file  Stress: Not on file  Social Connections: Not on file  Intimate Partner Violence: Not on file  Depression (EYV7-0): Medium Risk (06/08/2023)   Depression (PHQ2-9)    PHQ-2 Score: 6  Alcohol Screen: Not on file  Housing: Not on file  Utilities: Not on file  Health Literacy: Not on file    Family History  Family history unknown: Yes    Allergies[1]  Review of Systems  All other systems reviewed and are negative.      Objective:   BP 120/76   Pulse 84   Ht 5' 9 (1.753 m)   Wt 178 lb 3.2 oz (80.8 kg)   SpO2 99%   BMI 26.32 kg/m   Vitals:    03/09/24 1107  BP: 120/76  Pulse: 84  Height: 5' 9 (1.753 m)  Weight: 178 lb 3.2 oz (80.8 kg)  SpO2: 99%  BMI (Calculated): 26.3    Physical Exam Vitals and nursing note reviewed.  Constitutional:      Appearance: Normal appearance. He is normal weight.  Eyes:     Pupils: Pupils are equal, round, and reactive to light.  Cardiovascular:     Rate and Rhythm: Normal rate and regular rhythm.     Pulses: Normal pulses.     Heart sounds: Normal heart sounds.  Pulmonary:     Effort: Pulmonary effort is normal.     Breath sounds: Normal breath sounds.  Neurological:     Mental Status: He is alert.  Psychiatric:        Mood and Affect: Mood normal.        Behavior: Behavior normal.      No results found for any visits on 03/09/24.  No results found for this or any previous visit (from the past 2160 hours).     Assessment &  Plan:   Assessment & Plan     No follow-ups on file.   Total time spent: {AMA time spent:29001} minutes  ALAN CHRISTELLA ARRANT, FNP  03/09/2024   This document may have been prepared by [facility] Voice Recognition software and as such may include unintentional dictation errors.      [1] No Known Allergies  "

## 2024-03-10 LAB — CMP14+EGFR
ALT: 20 IU/L (ref 0–44)
AST: 24 IU/L (ref 0–40)
Albumin: 4.8 g/dL (ref 4.3–5.2)
Alkaline Phosphatase: 53 IU/L (ref 47–123)
BUN/Creatinine Ratio: 9 (ref 9–20)
BUN: 11 mg/dL (ref 6–20)
Bilirubin Total: 1 mg/dL (ref 0.0–1.2)
CO2: 24 mmol/L (ref 20–29)
Calcium: 9.9 mg/dL (ref 8.7–10.2)
Chloride: 98 mmol/L (ref 96–106)
Creatinine, Ser: 1.21 mg/dL (ref 0.76–1.27)
Globulin, Total: 3.1 g/dL (ref 1.5–4.5)
Glucose: 82 mg/dL (ref 70–99)
Potassium: 4.5 mmol/L (ref 3.5–5.2)
Sodium: 138 mmol/L (ref 134–144)
Total Protein: 7.9 g/dL (ref 6.0–8.5)
eGFR: 85 mL/min/1.73

## 2024-03-10 LAB — CBC WITH DIFFERENTIAL/PLATELET
Basophils Absolute: 0 x10E3/uL (ref 0.0–0.2)
Basos: 1 %
EOS (ABSOLUTE): 0 x10E3/uL (ref 0.0–0.4)
Eos: 1 %
Hematocrit: 44 % (ref 37.5–51.0)
Hemoglobin: 15 g/dL (ref 13.0–17.7)
Immature Grans (Abs): 0 x10E3/uL (ref 0.0–0.1)
Immature Granulocytes: 0 %
Lymphocytes Absolute: 2.6 x10E3/uL (ref 0.7–3.1)
Lymphs: 48 %
MCH: 32.2 pg (ref 26.6–33.0)
MCHC: 34.1 g/dL (ref 31.5–35.7)
MCV: 94 fL (ref 79–97)
Monocytes Absolute: 0.7 x10E3/uL (ref 0.1–0.9)
Monocytes: 14 %
Neutrophils Absolute: 1.9 x10E3/uL (ref 1.4–7.0)
Neutrophils: 36 %
Platelets: 271 x10E3/uL (ref 150–450)
RBC: 4.66 x10E6/uL (ref 4.14–5.80)
RDW: 12.1 % (ref 11.6–15.4)
WBC: 5.3 x10E3/uL (ref 3.4–10.8)

## 2024-03-10 LAB — HEMOGLOBIN A1C
Est. average glucose Bld gHb Est-mCnc: 117 mg/dL
Hgb A1c MFr Bld: 5.7 % — ABNORMAL HIGH (ref 4.8–5.6)

## 2024-03-10 LAB — LIPID PANEL
Chol/HDL Ratio: 5.1 ratio — ABNORMAL HIGH (ref 0.0–5.0)
Cholesterol, Total: 195 mg/dL (ref 100–199)
HDL: 38 mg/dL — ABNORMAL LOW
LDL Chol Calc (NIH): 143 mg/dL — ABNORMAL HIGH (ref 0–99)
Triglycerides: 77 mg/dL (ref 0–149)
VLDL Cholesterol Cal: 14 mg/dL (ref 5–40)

## 2024-03-10 LAB — TSH: TSH: 1.97 u[IU]/mL (ref 0.450–4.500)

## 2024-03-10 LAB — VITAMIN B12: Vitamin B-12: 626 pg/mL (ref 232–1245)

## 2024-03-10 LAB — VITAMIN D 25 HYDROXY (VIT D DEFICIENCY, FRACTURES): Vit D, 25-Hydroxy: 19.7 ng/mL — ABNORMAL LOW (ref 30.0–100.0)

## 2024-03-16 ENCOUNTER — Ambulatory Visit: Payer: Self-pay

## 2024-03-17 ENCOUNTER — Other Ambulatory Visit: Payer: Self-pay

## 2024-03-17 MED ORDER — VITAMIN D (ERGOCALCIFEROL) 1.25 MG (50000 UNIT) PO CAPS: 100000.0000 [IU] | ORAL_CAPSULE | ORAL | 1 refills | Status: AC

## 2024-06-08 ENCOUNTER — Ambulatory Visit: Admitting: Family
